# Patient Record
Sex: Female | Born: 1960
Health system: Southern US, Community
[De-identification: ages and names within clinical notes are randomized; demographics above are authoritative.]

## PROBLEM LIST (undated history)

## (undated) DIAGNOSIS — E119 Type 2 diabetes mellitus without complications: Secondary | ICD-10-CM

## (undated) DIAGNOSIS — E785 Hyperlipidemia, unspecified: Secondary | ICD-10-CM

## (undated) HISTORY — DX: Type 2 diabetes mellitus without complications: E11.9

## (undated) HISTORY — DX: Hyperlipidemia, unspecified: E78.5

## (undated) HISTORY — PX: HYSTEROSCOPY: SHX211

---

## 2016-08-12 ENCOUNTER — Ambulatory Visit (INDEPENDENT_AMBULATORY_CARE_PROVIDER_SITE_OTHER): Payer: BLUE CROSS/BLUE SHIELD

## 2016-08-12 ENCOUNTER — Ambulatory Visit (INDEPENDENT_AMBULATORY_CARE_PROVIDER_SITE_OTHER): Payer: BLUE CROSS/BLUE SHIELD | Admitting: Sports Medicine

## 2016-08-12 ENCOUNTER — Encounter: Payer: Self-pay | Admitting: Sports Medicine

## 2016-08-12 DIAGNOSIS — Z8781 Personal history of (healed) traumatic fracture: Secondary | ICD-10-CM | POA: Diagnosis not present

## 2016-08-12 DIAGNOSIS — S99921A Unspecified injury of right foot, initial encounter: Secondary | ICD-10-CM

## 2016-08-12 DIAGNOSIS — M79671 Pain in right foot: Secondary | ICD-10-CM

## 2016-08-12 DIAGNOSIS — M21619 Bunion of unspecified foot: Secondary | ICD-10-CM

## 2016-08-12 DIAGNOSIS — S8991XA Unspecified injury of right lower leg, initial encounter: Secondary | ICD-10-CM | POA: Diagnosis not present

## 2016-08-12 DIAGNOSIS — M779 Enthesopathy, unspecified: Secondary | ICD-10-CM

## 2016-08-12 NOTE — Patient Instructions (Signed)
Bunionectomy A bunionectomy is a surgical procedure to remove a bunion. A bunion is a visible bump of bone on the inside of your foot where your big toe meets the rest of your foot. A bunion can develop when pressure turns this bone (first metatarsal) toward the other toes. Shoes that are too tight are the most common cause of bunions. Bunions can also be caused by diseases, such as arthritis and polio. You may need a bunionectomy if your bunion is very large and painful or it affects your ability to walk. Tell a health care provider about:  Any allergies you have.  All medicines you are taking, including vitamins, herbs, eye drops, creams, and over-the-counter medicines.  Any problems you or family members have had with anesthetic medicines.  Any blood disorders you have.  Any surgeries you have had.  Any medical conditions you have. What are the risks? Generally, this is a safe procedure. However, problems may occur, including:  Infection.  Pain.  Nerve damage.  Bleeding or blood clots.  Reactions to medicines.  Numbness, stiffness, or arthritis in your toe.  Foot problems that continue even after the procedure. What happens before the procedure?  Ask your health care provider about:  Changing or stopping your regular medicines. This is especially important if you are taking diabetes medicines or blood thinners.  Taking medicines such as aspirin and ibuprofen. These medicines can thin your blood. Do not take these medicines before your procedure if your health care provider instructs you not to.  Do not drink alcohol before the procedure as directed by your health care provider.  Do not use tobacco products, including cigarettes, chewing tobacco, or electronic cigarettes, before the procedure as directed by your health care provider. If you need help quitting, ask your health care provider.  Ask your health care provider what kind of medicine you will be given during  your procedure. A bunionectomy may be done using one of these:  A medicine that numbs the area (local anesthetic).  A medicine that makes you go to sleep (general anesthetic). If you will be given general anesthetic, do not eat or drink anything after midnight on the night before the procedure or as directed by your health care provider. What happens during the procedure?  An IV tube may be inserted into a vein.  You will be given local anesthetic or general anesthetic.  The surgeon will make a cut (incision) over the enlarged area at the first joint of the big toe. The surgeon will remove the bunion.  You may have more than one incision if any of the bones in your big toe need to be moved. A bone itself may need to be cut.  Sometimes the tissues around the big toe may also need to be cut then tightened or loosened to reposition the toe.  Screws or other hardware may be used to keep your foot in thecorrect position.  The incision will be closed with stitches (sutures) and covered with adhesive strips or another type of bandage (dressing). What happens after the procedure?  You may spend some time in a recovery area.  Your blood pressure, heart rate, breathing rate, and blood oxygen level will be monitored often until the medicines you were given have worn off. This information is not intended to replace advice given to you by your health care provider. Make sure you discuss any questions you have with your health care provider. Document Released: 05/22/2005 Document Revised: 11/14/2015 Document Reviewed: 01/24/2014   Elsevier Interactive Patient Education  2017 [employer]  

## 2016-08-12 NOTE — Progress Notes (Signed)
Subjective: Kelsey Dickerson is a 56 y.o. female patient who presents to office for evaluation of Right bunion and second toe pain. Patient complains of progressive pain especially over the last year in the Right foot that starts as pain over the bump with direct pressure and range of motion; patient now has difficulty fitting shoes comfortably. Admits to a history of repeat fracture of right second toe with residual swelling. States that one time she even jammed the toe which are a steroid shot, however, after about 10 hours of work. There is pain and swelling at the base of the second toe joint and over bunion with redness. Patient has also tried changing shoes with minimal relief. Patient denies any other pedal complaints.   There are no active problems to display for this patient.   No current outpatient prescriptions on file prior to visit.   No current facility-administered medications on file prior to visit.     Not on File  Objective:  General: Alert and oriented x3 in no acute distress  Dermatology: No open lesions bilateral lower extremities, no webspace macerations, no ecchymosis bilateral, all nails x 10 are well manicured.  Vascular: Dorsalis Pedis and Posterior Tibial pedal pulses 2/4, Capillary Fill Time 3 seconds, (+) pedal hair growth bilateral, no edema bilateral lower extremities, Temperature gradient within normal limits.  Neurology: Gross sensation intact via light touch bilateral. Protective within normal limits. Patient admits to a history of prediabetes. A1c 5.6.  Musculoskeletal: Mild tenderness with palpation right bunion deformity, and with dorsiflexion of the right second toe with mild swelling focal to toe, there is decreased 1st MTPJ rom Right>Left with functional limitus noted, there is medial arch collapse Right> Left on weightbearing, rearfoot slight valgus, forefoot slight abduction with HAV deformity supported on ground with no second toe crossover deformity  noted.   Xrays  Right Foot    Impression: Intermetatarsal angle above normal limits , supportive of late great to early grade 3 bunion, with mild lateral deviation of the right second toe. No subsequent fracture, mild soft tissue swelling. Calcaneal spur. No other acute findings.      Assessment and Plan: Problem List Items Addressed This Visit    None    Visit Diagnoses    Right foot pain    -  Primary   Relevant Orders   DG Foot 2 Views Right (Completed)   Bunion       Capsulitis       Plantar plate injury, right, initial encounter       History of toe fracture       Right second with residual swelling       -Complete examination performed -Xrays reviewed -Discussed treatement options; discussed HAV deformityWith possible plantar plate tear in the setting of history of toe fracture at second toe on right;conservative and  Surgical management; risks, benefits, alternatives discussed. All patient's questions answered. -Rx MRI for further evaluation. Once completed, will formulate surgical plan. -Recommend continue with good supportive shoes and inserts.  -Patient to return to office after MRI or sooner if condition worsens.  Asencion Islamitorya Johnnae Impastato, DPM

## 2016-08-13 ENCOUNTER — Telehealth: Payer: Self-pay | Admitting: *Deleted

## 2016-08-13 DIAGNOSIS — M779 Enthesopathy, unspecified: Secondary | ICD-10-CM

## 2016-08-13 DIAGNOSIS — M21611 Bunion of right foot: Secondary | ICD-10-CM

## 2016-08-13 DIAGNOSIS — S99921D Unspecified injury of right foot, subsequent encounter: Secondary | ICD-10-CM

## 2016-08-13 NOTE — Telephone Encounter (Addendum)
-----   Message from [place]a Stover, [place] sent at 08/12/2016  4:34 PM EST ----- Regarding: MRI Right foot Bunion and 2nd toe joint eval for plantar plate tear. 08/13/2016-Orders given to [facility]. 08/24/2016-[facility] at 10:00am with 9:45am arrival. Left message with appt time. [facility].

## 2017-03-25 ENCOUNTER — Ambulatory Visit (INDEPENDENT_AMBULATORY_CARE_PROVIDER_SITE_OTHER): Payer: BLUE CROSS/BLUE SHIELD | Admitting: Sports Medicine

## 2017-03-25 ENCOUNTER — Encounter (INDEPENDENT_AMBULATORY_CARE_PROVIDER_SITE_OTHER): Payer: Self-pay

## 2017-03-25 DIAGNOSIS — S99921D Unspecified injury of right foot, subsequent encounter: Secondary | ICD-10-CM

## 2017-03-25 DIAGNOSIS — M21611 Bunion of right foot: Secondary | ICD-10-CM | POA: Diagnosis not present

## 2017-03-25 DIAGNOSIS — M779 Enthesopathy, unspecified: Secondary | ICD-10-CM | POA: Diagnosis not present

## 2017-03-25 DIAGNOSIS — M2041 Other hammer toe(s) (acquired), right foot: Secondary | ICD-10-CM | POA: Diagnosis not present

## 2017-03-25 DIAGNOSIS — M79671 Pain in right foot: Secondary | ICD-10-CM | POA: Diagnosis not present

## 2017-03-25 DIAGNOSIS — Z8781 Personal history of (healed) traumatic fracture: Secondary | ICD-10-CM

## 2017-03-25 NOTE — Patient Instructions (Signed)
Pre-Operative Instructions  Congratulations, you have decided to take an important step towards improving your quality of life.  You can be assured that the doctors and staff at [facility] will be with you every step of the way.  Here are some important things you should know:  1. Plan to be at the surgery center/hospital at least 1 (one) hour prior to your scheduled time, unless otherwise directed by the surgical center/hospital staff.  You must have a responsible adult accompany you, remain during the surgery and drive you home.  Make sure you have directions to the surgical center/hospital to ensure you arrive on time. 2. If you are having surgery at [facility] or [facility] hospitals, you will need a copy of your medical history and physical form from your family physician within one month prior to the date of surgery. We will give you a form for your primary physician to complete.  3. We make every effort to accommodate the date you request for surgery.  However, there are times where surgery dates or times have to be moved.  We will contact you as soon as possible if a change in schedule is required.   4. No aspirin/ibuprofen for one week before surgery.  If you are on aspirin, any non-steroidal anti-inflammatory medications (Mobic, Aleve, Ibuprofen) should not be taken seven (7) days prior to your surgery.  You make take Tylenol for pain prior to surgery.  5. Medications - If you are taking daily heart and blood pressure medications, seizure, reflux, allergy, asthma, anxiety, pain or diabetes medications, make sure you notify the surgery center/hospital before the day of surgery so they can tell you which medications you should take or avoid the day of surgery. 6. No food or drink after midnight the night before surgery unless directed otherwise by surgical center/hospital staff. 7. No alcoholic beverages 24-hours prior to surgery.  No smoking 24-hours prior or 24-hours after  surgery. 8. Wear loose pants or shorts. They should be loose enough to fit over bandages, boots, and casts. 9. Don't wear slip-on shoes. Sneakers are preferred. 10. Bring your boot with you to the surgery center/hospital.  Also bring crutches or a walker if your physician has prescribed it for you.  If you do not have this equipment, it will be provided for you after surgery. 11. If you have not been contacted by the surgery center/hospital by the day before your surgery, call to confirm the date and time of your surgery. 12. Leave-time from work may vary depending on the type of surgery you have.  Appropriate arrangements should be made prior to surgery with your employer. 13. Prescriptions will be provided immediately following surgery by your doctor.  Fill these as soon as possible after surgery and take the medication as directed. Pain medications will not be refilled on weekends and must be approved by the doctor. 14. Remove nail polish on the operative foot and avoid getting pedicures prior to surgery. 15. Wash the night before surgery.  The night before surgery wash the foot and leg well with water and the antibacterial soap provided. Be sure to pay special attention to beneath the toenails and in between the toes.  Wash for at least three (3) minutes. Rinse thoroughly with water and dry well with a towel.  Perform this wash unless told not to do so by your physician.  Enclosed: 1 Ice pack (please put in freezer the night before surgery)   1 Hibiclens skin cleaner     Pre-op instructions  If you have any questions regarding the instructions, please do not hesitate to call our office.  [place]: [address], [place], [place] [address] -- [phone]  [place]: [address] [place], [place] [address] -- [phone]  [place]: [address]  [place], [place] [address] -- [phone]  [place]: [address], [address], [place], [place] [address] -- [phone]  Website:  [email or web address] 

## 2017-03-25 NOTE — Progress Notes (Signed)
Subjective: Kelsey Dickerson is a 56 y.o. female patient who returns to office for follow-up evaluation of Right bunion and second toe pain. Patient states that she finally got her MRI done and now wants to stalk about surgery. Patient still complains of progressive pain that starts as pain over the bump with direct pressure and range of motion and has difficulty fitting shoes comfortably. Admits to pain and numbness over the second and now third toe. There is pain and swelling at the base of the second toe joint and over bunion with redness. Patient has also tried changing shoes, rest, icing, Motrin with minimal relief. Patient denies any other pedal complaints.   There are no active problems to display for this patient.   Current Outpatient Prescriptions on File Prior to Visit  Medication Sig Dispense Refill  . ACCU-CHEK AVIVA PLUS test strip TEST QD AS DIRECTED  6  . ACCU-CHEK SOFTCLIX LANCETS lancets TEST QD AS DIRECTED  6  . ciprofloxacin (CIPRO) 500 MG tablet     . metFORMIN (GLUCOPHAGE) 500 MG tablet     . simvastatin (ZOCOR) 40 MG tablet      No current facility-administered medications on file prior to visit.     Allergies  Allergen Reactions  . Oxycodone   . Penicillins    No family history on file.   No past surgical history on file.   Objective:  General: Alert and oriented x3 in no acute distress  Dermatology: No open lesions bilateral lower extremities, no webspace macerations, no ecchymosis bilateral, all nails x 10 are well manicured.  Vascular: Dorsalis Pedis and Posterior Tibial pedal pulses 2/4, Capillary Fill Time 3 seconds, (+) pedal hair growth bilateral, no edema bilateral lower extremities, Temperature gradient within normal limits.  Neurology: Gross sensation intact via light touch bilateral. Protective within normal limits. Patient admits to a history of prediabetes. A1c 5.6.  Musculoskeletal: Mild tenderness with palpation right bunion deformity, and with  dorsiflexion of the right second toe with mild swelling focal to toe, there is decreased 1st MTPJ rom Right>Left with functional limitus noted, there is medial arch collapse Right> Left on weightbearing, rearfoot slight valgus, forefoot slight abduction with HAV deformity supported on ground with no second toe crossover deformity noted.        Assessment and Plan: Problem List Items Addressed This Visit    None    Visit Diagnoses    Bunion, right foot    -  Primary   Relevant Orders   DME Other see comment   Capsulitis       Relevant Orders   DME Other see comment   Injury of plantar plate of right foot, subsequent encounter       Relevant Orders   DME Other see comment   Right foot pain       Relevant Orders   DME Other see comment   Hammer toe of right foot       Relevant Orders   DME Other see comment   History of toe fracture       Relevant Orders   DME Other see comment      -Complete examination performed -Xrays And previous MRI reviewed -Discussed treatement options; discussed HAV deformityWith possible plantar plate tear in the setting of history of toe fracture at second toe on right with residual digital deformity;conservative and  Surgical management; risks, benefits, alternatives discussed. All patient's questions answered. -Patient opt for surgical management. Consent obtained for right long arm Austin bunionectomy  with second hammertoe repair with K wire and possible second metatarsal osteotomy with plantar plate repair. Pre and Post op course explained. Risks, benefits, alternatives explained. No guarantees given or implied. Surgical booking slip submitted and provided patient with Surgical packet and info for [facility] -Dispensed CAM Walker to use post op and advised nonweightbearing for 6 weeks; ordered knee scooter and crutches -Recommend continue with good supportive shoes and inserts. Meanwhile until time of surgery.  -Patient to return to office after surgery or  sooner if condition worsens.  Asencion Islam, DPM

## 2017-03-31 ENCOUNTER — Telehealth: Payer: Self-pay | Admitting: *Deleted

## 2017-03-31 NOTE — Telephone Encounter (Signed)
"  I'm calling to try to get an appointment set up for foot surgery with Dr. Marylene Land at the [facility].  If you will please give me a call."

## 2017-04-06 NOTE — Telephone Encounter (Signed)
I'm trying to see if I can get a surgery date of November 12.  Is anything available for that.  I'm trying to get my family leave papers done.  If you can give me a call back."  I'm returning your call.  November 12 is available for surgery.  "Randie Heinz, that works out well.  Sonny Dandy was talking to me about FMLA paperwork and I told them I didn't have a date yet.  You will give me a call a day or two prior to the surgery date with the time?"  No, I will not but someone from the surgical center will call you with the arrival time.

## 2017-04-22 HISTORY — PX: FOOT SURGERY: SHX648

## 2017-05-03 ENCOUNTER — Encounter: Payer: Self-pay | Admitting: Sports Medicine

## 2017-05-03 DIAGNOSIS — M2041 Other hammer toe(s) (acquired), right foot: Secondary | ICD-10-CM | POA: Diagnosis not present

## 2017-05-03 DIAGNOSIS — M2011 Hallux valgus (acquired), right foot: Secondary | ICD-10-CM | POA: Diagnosis not present

## 2017-05-03 DIAGNOSIS — M205X1 Other deformities of toe(s) (acquired), right foot: Secondary | ICD-10-CM | POA: Diagnosis not present

## 2017-05-03 DIAGNOSIS — M7751 Other enthesopathy of right foot: Secondary | ICD-10-CM | POA: Diagnosis not present

## 2017-05-04 ENCOUNTER — Telehealth: Payer: Self-pay | Admitting: Sports Medicine

## 2017-05-04 NOTE — Telephone Encounter (Signed)
Post op check, patient states that she is doing good. Block is starting to wear off but pain medication is helping. Patient states that her Dilaudid pain medication is helping. Patient denies any other symptoms. I reminded patient to continue with rest, ice, elevation, and use of knee scooter. Patient to follow up as scheduled for continued post care. -Dr. Marylene LandStover

## 2017-05-05 ENCOUNTER — Telehealth: Payer: Self-pay | Admitting: *Deleted

## 2017-05-05 NOTE — Telephone Encounter (Signed)
We have you scheduled for surgery on 05/19/2017.  Are you having surgery on your other foot?  "No, I'm not.  November 12 was the only date I was supposed to be scheduled."  I wanted to make sure before I canceled it.  How's your foot doing?  "It's doing well, no complaints.  Well keep it elevated as much as possible.  Take your pain medications as needed.  Use the ice packs.  "Okay thank you."

## 2017-05-12 ENCOUNTER — Ambulatory Visit (INDEPENDENT_AMBULATORY_CARE_PROVIDER_SITE_OTHER): Payer: BLUE CROSS/BLUE SHIELD | Admitting: Sports Medicine

## 2017-05-12 ENCOUNTER — Encounter: Payer: Self-pay | Admitting: Sports Medicine

## 2017-05-12 ENCOUNTER — Ambulatory Visit (INDEPENDENT_AMBULATORY_CARE_PROVIDER_SITE_OTHER): Payer: BLUE CROSS/BLUE SHIELD

## 2017-05-12 DIAGNOSIS — M2041 Other hammer toe(s) (acquired), right foot: Secondary | ICD-10-CM | POA: Diagnosis not present

## 2017-05-12 DIAGNOSIS — M79671 Pain in right foot: Secondary | ICD-10-CM

## 2017-05-12 DIAGNOSIS — Z9889 Other specified postprocedural states: Secondary | ICD-10-CM

## 2017-05-12 DIAGNOSIS — M21611 Bunion of right foot: Secondary | ICD-10-CM

## 2017-05-12 DIAGNOSIS — S99921D Unspecified injury of right foot, subsequent encounter: Secondary | ICD-10-CM

## 2017-05-12 NOTE — Progress Notes (Signed)
Subjective: Kelsey Dickerson is a 56 y.o. female patient seen today in office for POV #1 (DOS 05-03-17), S/P Right long arm austin bunionectomy and 2nd hammertoe repair with kwire and plantar plate repair. Patient denies pain at surgical site, denies calf pain, denies headache, chest pain, shortness of breath, nausea, vomiting, fever, or chills. Patient states that she is doing well and is only taking Ibuprofen as needed and has not taken her pain medication since Thursday. No other issues noted.   There are no active problems to display for this patient.   Current Outpatient Medications on File Prior to Visit  Medication Sig Dispense Refill  . ACCU-CHEK AVIVA PLUS test strip TEST QD AS DIRECTED  6  . ACCU-CHEK SOFTCLIX LANCETS lancets TEST QD AS DIRECTED  6  . ciprofloxacin (CIPRO) 500 MG tablet     . metFORMIN (GLUCOPHAGE) 500 MG tablet     . simvastatin (ZOCOR) 40 MG tablet      No current facility-administered medications on file prior to visit.     Allergies  Allergen Reactions  . Oxycodone   . Penicillins     Objective: There were no vitals filed for this visit.  General: No acute distress, AAOx3  Right foot: Kwire and Sutures intact with no gapping or dehiscence at surgical site, mild swelling to right forefoot, no erythema, no warmth, no drainage, no signs of infection noted, Capillary fill time <3 seconds in all digits, gross sensation present via light touch to right foot. No pain or crepitation with range of motion right foot except with mild guarding at surgical sites.  No pain with calf compression.   Post Op Xray, Right foot: Kwire in excellent alignment and position. Osteotomy site healing. Hardware intact at 1st metatarsal. Soft tissue swelling within normal limits for post op status.   Assessment and Plan:  Problem List Items Addressed This Visit    None    Visit Diagnoses    Hammer toe of right foot    -  Primary   Relevant Orders   DG Foot Complete Right   Bunion, right foot       Injury of plantar plate of right foot, subsequent encounter       Right foot pain       S/P foot surgery, right          -Patient seen and evaluated -Xrays reviewed  -Applied dry sterile dressing to surgical site right foot secured with ACE wrap and stockinet  -Advised patient to make sure to keep dressings clean, dry, and intact to right surgical site, removing the ACE as needed  -Advised patient to continue with CAM boot and NWB on right with assistance of knee scooter -Advised patient to limit activity to necessity  -Advised patient to ice and elevate as necessary  -Continue with PRN meds -Will plan for possible suture removal at next office visit. Rober MinionKwire will stay in place for at least 1 month. In the meantime, patient to call office if any issues or problems arise.   Asencion Islamitorya Sekai Nayak, DPM

## 2017-05-19 ENCOUNTER — Encounter: Payer: BLUE CROSS/BLUE SHIELD | Admitting: Sports Medicine

## 2017-05-20 ENCOUNTER — Encounter: Payer: Self-pay | Admitting: Sports Medicine

## 2017-05-20 ENCOUNTER — Ambulatory Visit (INDEPENDENT_AMBULATORY_CARE_PROVIDER_SITE_OTHER): Payer: BLUE CROSS/BLUE SHIELD | Admitting: Sports Medicine

## 2017-05-20 DIAGNOSIS — Z9889 Other specified postprocedural states: Secondary | ICD-10-CM

## 2017-05-20 DIAGNOSIS — M21611 Bunion of right foot: Secondary | ICD-10-CM

## 2017-05-20 DIAGNOSIS — S99921D Unspecified injury of right foot, subsequent encounter: Secondary | ICD-10-CM

## 2017-05-20 DIAGNOSIS — M2041 Other hammer toe(s) (acquired), right foot: Secondary | ICD-10-CM

## 2017-05-20 DIAGNOSIS — M79671 Pain in right foot: Secondary | ICD-10-CM

## 2017-05-20 NOTE — Progress Notes (Signed)
Subjective: Kelsey Dickerson is a 56 y.o. female patient seen today in office for POV #2 (DOS 05-03-17), S/P Right long arm austin bunionectomy and 2nd hammertoe repair with kwire and plantar plate repair. Patient denies pain at surgical site, denies calf pain, denies headache, chest pain, shortness of breath, nausea, vomiting, fever, or chills. Patient states that she is doing well and is only taking Tylenol as needed. No other issues noted.   There are no active problems to display for this patient.   Current Outpatient Medications on File Prior to Visit  Medication Sig Dispense Refill  . ACCU-CHEK AVIVA PLUS test strip TEST QD AS DIRECTED  6  . ACCU-CHEK SOFTCLIX LANCETS lancets TEST QD AS DIRECTED  6  . ciprofloxacin (CIPRO) 500 MG tablet     . metFORMIN (GLUCOPHAGE) 500 MG tablet     . simvastatin (ZOCOR) 40 MG tablet      No current facility-administered medications on file prior to visit.     Allergies  Allergen Reactions  . Oxycodone   . Penicillins     Objective: There were no vitals filed for this visit.  General: No acute distress, AAOx3  Right foot: Kwire and Sutures intact with no gapping or dehiscence at surgical site, mild swelling to right forefoot, no erythema, no warmth, no drainage, no signs of infection noted, Capillary fill time <3 seconds in all digits, gross sensation present via light touch to right foot. No pain or crepitation with range of motion right foot except with mild guarding at surgical sites.  No pain with calf compression.   Assessment and Plan:  Problem List Items Addressed This Visit    None    Visit Diagnoses    Hammer toe of right foot    -  Primary   Bunion, right foot       Injury of plantar plate of right foot, subsequent encounter       Right foot pain       S/P foot surgery, right          -Patient seen and evaluated -Sutures removed -Applied dry sterile dressing to surgical site right foot secured with ACE wrap and stockinet   -Advised patient to make sure to keep dressings clean, dry, and intact to right surgical site, removing the ACE as needed  -Advised patient to continue with CAM boot and NWB on right with assistance of knee scooter -Advised patient to limit activity to necessity  -Advised patient to ice and elevate as necessary  -Continue with PRN meds -Will plan for possible K wire removal and transition to weightbearing with boot at next office visit.  In the meantime, patient to call office if any issues or problems arise.   Asencion Islamitorya [name], DPM

## 2017-06-04 ENCOUNTER — Encounter: Payer: Self-pay | Admitting: Sports Medicine

## 2017-06-04 ENCOUNTER — Ambulatory Visit (INDEPENDENT_AMBULATORY_CARE_PROVIDER_SITE_OTHER): Payer: BLUE CROSS/BLUE SHIELD

## 2017-06-04 ENCOUNTER — Ambulatory Visit (INDEPENDENT_AMBULATORY_CARE_PROVIDER_SITE_OTHER): Payer: BLUE CROSS/BLUE SHIELD | Admitting: Sports Medicine

## 2017-06-04 DIAGNOSIS — M21611 Bunion of right foot: Secondary | ICD-10-CM | POA: Diagnosis not present

## 2017-06-04 DIAGNOSIS — M79671 Pain in right foot: Secondary | ICD-10-CM

## 2017-06-04 DIAGNOSIS — M2041 Other hammer toe(s) (acquired), right foot: Secondary | ICD-10-CM

## 2017-06-04 DIAGNOSIS — Z9889 Other specified postprocedural states: Secondary | ICD-10-CM

## 2017-06-04 DIAGNOSIS — S99921D Unspecified injury of right foot, subsequent encounter: Secondary | ICD-10-CM

## 2017-06-04 NOTE — Progress Notes (Signed)
Subjective: Emerie SwazilandJordan is a 56 y.o. female patient seen today in office for POV #3 (DOS 05-03-17), S/P Right long arm austin bunionectomy and 2nd hammertoe repair with kwire and plantar plate repair. Patient denies pain at surgical site, admits swelling but no pain, denies calf pain, denies headache, chest pain, shortness of breath, nausea, vomiting, fever, or chills. Patient states that she is doing well and is only taking Tylenol as needed. No other issues noted.   There are no active problems to display for this patient.   Current Outpatient Medications on File Prior to Visit  Medication Sig Dispense Refill  . ACCU-CHEK AVIVA PLUS test strip TEST QD AS DIRECTED  6  . ACCU-CHEK SOFTCLIX LANCETS lancets TEST QD AS DIRECTED  6  . ciprofloxacin (CIPRO) 500 MG tablet     . metFORMIN (GLUCOPHAGE) 500 MG tablet     . simvastatin (ZOCOR) 40 MG tablet      No current facility-administered medications on file prior to visit.     Allergies  Allergen Reactions  . Oxycodone   . Penicillins     Objective: There were no vitals filed for this visit.  General: No acute distress, AAOx3  Right foot: Kwire intact at 2nd toe with no gapping or dehiscence at surgical site(s), moderate swelling to right forefoot, no erythema, no warmth, no drainage, no signs of infection noted, Capillary fill time <3 seconds in all digits, gross sensation present via light touch to right foot. No pain or crepitation with range of motion right foot.  No pain with calf compression. No signs of DVT.   Assessment and Plan:  Problem List Items Addressed This Visit    None    Visit Diagnoses    Bunion, right foot    -  Primary   Relevant Orders   DG Foot Complete Right   Hammer toe of right foot       Injury of plantar plate of right foot, subsequent encounter       Right foot pain       S/P foot surgery, right          -Patient seen and evaluated -Xrays consistent with post op status no acute findings  -Kwire  removed -Applied steristrips to 2nd toe and Surgi-tube compression sleeve -Patient may shower on tomorrow  -Advised patient to limit activity to tolerance  -Advised patient to ice and elevate as necessary -Advised motrin OTC to assist with edema control at post op foot   -Continue with PRN meds -Will plan for transition to post op shoe and to allow patient to use OTC scar creams at next office visit.  In the meantime, patient to call office if any issues or problems arise.   Asencion Islamitorya Maat Kafer, DPM

## 2017-06-17 ENCOUNTER — Encounter: Payer: Self-pay | Admitting: Podiatry

## 2017-06-17 ENCOUNTER — Ambulatory Visit (INDEPENDENT_AMBULATORY_CARE_PROVIDER_SITE_OTHER): Payer: BLUE CROSS/BLUE SHIELD | Admitting: Podiatry

## 2017-06-17 ENCOUNTER — Ambulatory Visit: Payer: BLUE CROSS/BLUE SHIELD

## 2017-06-17 DIAGNOSIS — Z9889 Other specified postprocedural states: Secondary | ICD-10-CM | POA: Diagnosis not present

## 2017-06-17 DIAGNOSIS — M21611 Bunion of right foot: Secondary | ICD-10-CM

## 2017-06-17 NOTE — Progress Notes (Signed)
  Subjective:  Patient ID: Kelsey Dickerson, female    DOB: 07/24/1960,  MRN: [id number]  Chief Complaint  Patient presents with  . Routine Post Op    (DOS 05-03-17), S/P Right long arm austin bunionectomy and 2nd hammertoe repair with kwire and plantar plate repair   DOS: 05/03/17 Procedure: Right long-arm Austin bunionectomy second hammertoe repair, plantar plate repair  56 y.o. female returns for post-op check. Last saw Dr. Marylene LandStover 12/14 where she had the pin removed from her toe.  Doing well but does endorse some swelling.  States that wearing compression stocking does help.  Objective:   General AA&O x3. Normal mood and affect.  Vascular Foot warm and well perfused.  Neurologic Gross sensation intact.  Dermatologic  incisions well-healed with slight hypertrophic scar  Orthopedic: Slight tenderness to palpation noted about the surgical site.    Assessment & Plan:  Patient was evaluated and treated and all questions answered.  S/p R Long-arm Austin ectomy, second hammertoe repair, plantar plate repair -Progressing as expected post-operatively. -Skin well healed. -Tubigrip dispensed -Dispensed surgical shoe.  F/u with Dr. Marylene LandStover 2 weeks for new XRs.

## 2017-07-01 ENCOUNTER — Encounter: Payer: Self-pay | Admitting: Sports Medicine

## 2017-07-01 ENCOUNTER — Ambulatory Visit (INDEPENDENT_AMBULATORY_CARE_PROVIDER_SITE_OTHER): Payer: BLUE CROSS/BLUE SHIELD

## 2017-07-01 ENCOUNTER — Ambulatory Visit (INDEPENDENT_AMBULATORY_CARE_PROVIDER_SITE_OTHER): Payer: BLUE CROSS/BLUE SHIELD | Admitting: Sports Medicine

## 2017-07-01 DIAGNOSIS — Z9889 Other specified postprocedural states: Secondary | ICD-10-CM

## 2017-07-01 DIAGNOSIS — S99921D Unspecified injury of right foot, subsequent encounter: Secondary | ICD-10-CM

## 2017-07-01 DIAGNOSIS — M2041 Other hammer toe(s) (acquired), right foot: Secondary | ICD-10-CM

## 2017-07-01 DIAGNOSIS — M7989 Other specified soft tissue disorders: Secondary | ICD-10-CM

## 2017-07-01 DIAGNOSIS — M21611 Bunion of right foot: Secondary | ICD-10-CM

## 2017-07-01 DIAGNOSIS — M79671 Pain in right foot: Secondary | ICD-10-CM

## 2017-07-01 NOTE — Progress Notes (Signed)
Subjective: Reality Swazil[place]Jordan is a 57 y.o. female patient seen today in office for POV #5 (DOS 05-03-17), S/P Right long arm austin bunionectomy and 2nd hammertoe repair with kwire and plantar plate repair. Patient denies pain at surgical site, admits swelling and some sensitivity along her incision sites, denies calf pain, denies headache, chest pain, shortness of breath, nausea, vomiting, fever, or chills. Patient states that she is doing well and is only taking Tylenol as needed but is still concerned about the swelling. No other issues noted.   There are no active problems to display for this patient.   Current Outpatient Medications on File Prior to Visit  Medication Sig Dispense Refill  . ACCU-CHEK AVIVA PLUS test strip TEST QD AS DIRECTED  6  . ACCU-CHEK SOFTCLIX LANCETS lancets TEST QD AS DIRECTED  6  . ciprofloxacin (CIPRO) 500 MG tablet     . metFORMIN (GLUCOPHAGE) 500 MG tablet     . simvastatin (ZOCOR) 40 MG tablet      No current facility-administered medications on file prior to visit.     Allergies  Allergen Reactions  . Oxycodone   . Penicillins     Objective: There were no vitals filed for this visit.  General: No acute distress, AAOx3  Right foot: Incision sites healing well, moderate swelling to right forefoot, no erythema, no warmth, no drainage, no signs of infection noted, Capillary fill time <3 seconds in all digits, gross sensation present via light touch to right foot. No pain or crepitation with range of motion right foot.  No pain with calf compression. No signs of DVT.   Assessment and Plan:  Problem List Items Addressed This Visit    None    Visit Diagnoses    Bunion, right foot    -  Primary   Relevant Orders   DG Foot Complete Right   Hammer toe of right foot       Injury of plantar plate of right foot, subsequent encounter       Post-operative state       Swelling of right foot       Right foot pain          -Patient seen and evaluated -Xrays  consistent with post op status, hardware intact, no acute findings  -Recommend topical scar creams and gels and pain relievers -Dispense compression anklet to use as instructed -Continue with postop shoe and slowly wean to tennis shoe as swelling decreases -Advised patient to limit activity to tolerance  -Advised patient to ice and elevate as necessary -Advised motrin OTC to assist with edema control at post op foot   -Continue with PRN meds -Will plan for postop check and increasing activities at next office visit.  In the meantime, patient to call office if any issues or problems arise.   Asencion Islamitorya Tereasa Yilmaz, DPM

## 2017-07-15 ENCOUNTER — Telehealth: Payer: Self-pay | Admitting: Sports Medicine

## 2017-07-15 ENCOUNTER — Encounter: Payer: Self-pay | Admitting: Sports Medicine

## 2017-07-15 ENCOUNTER — Ambulatory Visit (INDEPENDENT_AMBULATORY_CARE_PROVIDER_SITE_OTHER): Payer: BLUE CROSS/BLUE SHIELD | Admitting: Sports Medicine

## 2017-07-15 DIAGNOSIS — M79671 Pain in right foot: Secondary | ICD-10-CM

## 2017-07-15 DIAGNOSIS — M21611 Bunion of right foot: Secondary | ICD-10-CM

## 2017-07-15 DIAGNOSIS — Z9889 Other specified postprocedural states: Secondary | ICD-10-CM

## 2017-07-15 DIAGNOSIS — M7989 Other specified soft tissue disorders: Secondary | ICD-10-CM

## 2017-07-15 DIAGNOSIS — M2041 Other hammer toe(s) (acquired), right foot: Secondary | ICD-10-CM

## 2017-07-15 DIAGNOSIS — S99921D Unspecified injury of right foot, subsequent encounter: Secondary | ICD-10-CM

## 2017-07-15 NOTE — Progress Notes (Signed)
For subjective: Kelsey Dickerson is a 57 y.o. female patient seen today in office for POV #6 (DOS 05-03-17), S/P Right long arm austin bunionectomy and 2nd hammertoe repair with kwire and plantar plate repair. Patient denies pain at surgical site, admits swelling and some sensitivity along her incision 1st incision and to the ball of the foot that is not bad, glad to be back in tennis shoes, denies calf pain, denies headache, chest pain, shortness of breath, nausea, vomiting, fever, or chills. No other issues noted.   There are no active problems to display for this patient.   Current Outpatient Medications on File Prior to Visit  Medication Sig Dispense Refill  . ACCU-CHEK AVIVA PLUS test strip TEST QD AS DIRECTED  6  . ACCU-CHEK SOFTCLIX LANCETS lancets TEST QD AS DIRECTED  6  . ciprofloxacin (CIPRO) 500 MG tablet     . metFORMIN (GLUCOPHAGE) 500 MG tablet     . simvastatin (ZOCOR) 40 MG tablet      No current facility-administered medications on file prior to visit.     Allergies  Allergen Reactions  . Oxycodone   . Penicillins     Objective: There were no vitals filed for this visit.  General: No acute distress, AAOx3  Right foot: Incision sites well healed, mild swelling to right forefoot, no erythema, no warmth, no drainage, no signs of infection noted, Capillary fill time <3 seconds in all digits, gross sensation present via light touch to right foot. No pain or crepitation with range of motion right foot.  No pain with calf compression. No signs of DVT.   Assessment and Plan:  Problem List Items Addressed This Visit    None    Visit Diagnoses    Bunion, right foot    -  Primary   Hammer toe of right foot       Injury of plantar plate of right foot, subsequent encounter       Post-operative state       Swelling of right foot       Right foot pain       S/P foot surgery, right          -Patient seen and evaluated -Dispensed metatarsal padding  -Recommend increase range  of motion exercises  -Recommend continue with topical scar creams and gels and pain relievers -Continue with compression anklet to use as instructed -Continue with good supportive tennis shoes -Advised patient to limit activity to tolerance  -Advised patient to ice and elevate as necessary -Advised motrin OTC to assist with edema control at post op foot   -Continue with PRN meds -Work note: Return on 07-26-17. Modified work hours of no more than 5 hours per shift, full duties without any limitations.  -Will plan for final post op check and increasing work hours at next office visit.  In the meantime, patient to call office if any issues or problems arise.   Asencion Islamitorya Dustina Scoggin, DPM

## 2017-07-15 NOTE — Telephone Encounter (Signed)
Pt was seen today wants to know if she can go back to work full time 10 hours if they have sit down jobs. She doesn't think they will allow she to do the 5 hours shift.

## 2017-07-16 NOTE — Telephone Encounter (Signed)
Yes she can do 10 hours if they allow sitting Thanks Dr. SKathie Rhodes

## 2017-07-23 NOTE — Progress Notes (Signed)
DoS 05/03/17 Rt bunionectomy, 2nd hammertoe repair, 2nd metatarsal osteotmy and plantar plate repair

## 2017-08-12 ENCOUNTER — Encounter: Payer: Self-pay | Admitting: Sports Medicine

## 2017-08-12 ENCOUNTER — Ambulatory Visit (INDEPENDENT_AMBULATORY_CARE_PROVIDER_SITE_OTHER): Payer: BLUE CROSS/BLUE SHIELD | Admitting: Sports Medicine

## 2017-08-12 DIAGNOSIS — M722 Plantar fascial fibromatosis: Secondary | ICD-10-CM

## 2017-08-12 DIAGNOSIS — M2041 Other hammer toe(s) (acquired), right foot: Secondary | ICD-10-CM

## 2017-08-12 DIAGNOSIS — S99921D Unspecified injury of right foot, subsequent encounter: Secondary | ICD-10-CM

## 2017-08-12 DIAGNOSIS — Z9889 Other specified postprocedural states: Secondary | ICD-10-CM

## 2017-08-12 DIAGNOSIS — M21611 Bunion of right foot: Secondary | ICD-10-CM

## 2017-08-12 NOTE — Progress Notes (Signed)
For subjective: Kelsey Dickerson is a 57 y.o. female patient seen today in office for POV #7 (DOS 05-03-17), S/P Right long arm austin bunionectomy and 2nd hammertoe repair with kwire and plantar plate repair. Patient admits a little bit of tenderness now that she is back at work, admits swelling and some occasional tingling pain along the incision areas.  Patient states that she has picked up scar cream and gel which has helped and states that little by little she is getting more motion in her toes.  Reports that she has been using wider shoes with no issues and reports that she was working 5 hours with a job that allows sitting.  Patient denies calf pain, denies headache, chest pain, shortness of breath, nausea, vomiting, fever, or chills. No other issues noted.   There are no active problems to display for this patient.   Current Outpatient Medications on File Prior to Visit  Medication Sig Dispense Refill  . ACCU-CHEK AVIVA PLUS test strip TEST QD AS DIRECTED  6  . ACCU-CHEK SOFTCLIX LANCETS lancets TEST QD AS DIRECTED  6  . ciprofloxacin (CIPRO) 500 MG tablet     . metFORMIN (GLUCOPHAGE) 500 MG tablet     . simvastatin (ZOCOR) 40 MG tablet      No current facility-administered medications on file prior to visit.     Allergies  Allergen Reactions  . Oxycodone   . Penicillins     Objective: There were no vitals filed for this visit.  General: No acute distress, AAOx3  Right foot: Incision sites well healed, mild swelling to right forefoot, no erythema, no warmth, no drainage, no signs of infection noted, Capillary fill time <3 seconds in all digits, gross sensation present via light touch to right foot. No pain or crepitation with range of motion right foot.  No pain with calf compression. No signs of DVT.   No reproducible pain along plantar fascia history of pain and fatigue in both feet.  Assessment and Plan:  Problem List Items Addressed This Visit    None    Visit Diagnoses    Bunion, right foot    -  Primary   Hammer toe of right foot       Injury of plantar plate of right foot, subsequent encounter       Post-operative state       Swelling of right foot       Right foot pain       S/P foot surgery, right          -Patient seen and evaluated -Discussed long-term care after surgery -Recommend to continue with range of motion exercises  -Recommend continue with topical scar creams and gels and pain relievers as needed -Continue with compression anklet to use as instructed until swelling is resolved -Continue with good supportive tennis shoes and may slowly try other shoes as tolerated -Advised patient to limit activity to tolerance  -Advised patient to ice and elevate as necessary -Advised motrin OTC to assist with edema control at post op foot as needed -Advised patient to consider custom molded orthotics.  Office will check benefits and schedule if patient desires to proceed especially since patient has had right foot surgery and a history of bunion starting on the left and a history of pain and fatigue and feet. -Work note: Full duty of 10 hours with allowance of sitting as needed for foot pain -Will plan for x-ray and final post op check at next office visit  in 6 weeks.  In the meantime, patient to call office if any issues or problems arise.   Kelsey Dickerson, DPM

## 2017-08-16 ENCOUNTER — Other Ambulatory Visit: Payer: Self-pay | Admitting: Sports Medicine

## 2017-08-16 DIAGNOSIS — M2041 Other hammer toe(s) (acquired), right foot: Secondary | ICD-10-CM

## 2017-08-16 DIAGNOSIS — M21611 Bunion of right foot: Secondary | ICD-10-CM

## 2017-08-16 DIAGNOSIS — Z9889 Other specified postprocedural states: Secondary | ICD-10-CM

## 2017-08-31 ENCOUNTER — Other Ambulatory Visit: Payer: Self-pay | Admitting: Sports Medicine

## 2017-08-31 DIAGNOSIS — S99921D Unspecified injury of right foot, subsequent encounter: Secondary | ICD-10-CM

## 2017-08-31 DIAGNOSIS — Z9889 Other specified postprocedural states: Secondary | ICD-10-CM

## 2017-08-31 DIAGNOSIS — M2041 Other hammer toe(s) (acquired), right foot: Secondary | ICD-10-CM

## 2017-08-31 DIAGNOSIS — M21611 Bunion of right foot: Secondary | ICD-10-CM

## 2017-09-23 ENCOUNTER — Other Ambulatory Visit: Payer: Self-pay | Admitting: Sports Medicine

## 2017-09-23 ENCOUNTER — Encounter: Payer: Self-pay | Admitting: Sports Medicine

## 2017-09-23 ENCOUNTER — Ambulatory Visit (INDEPENDENT_AMBULATORY_CARE_PROVIDER_SITE_OTHER): Payer: BLUE CROSS/BLUE SHIELD | Admitting: Sports Medicine

## 2017-09-23 ENCOUNTER — Ambulatory Visit (INDEPENDENT_AMBULATORY_CARE_PROVIDER_SITE_OTHER): Payer: BLUE CROSS/BLUE SHIELD

## 2017-09-23 DIAGNOSIS — M2041 Other hammer toe(s) (acquired), right foot: Secondary | ICD-10-CM | POA: Diagnosis not present

## 2017-09-23 DIAGNOSIS — M21611 Bunion of right foot: Secondary | ICD-10-CM

## 2017-09-23 DIAGNOSIS — S99921D Unspecified injury of right foot, subsequent encounter: Secondary | ICD-10-CM

## 2017-09-23 DIAGNOSIS — Z9889 Other specified postprocedural states: Secondary | ICD-10-CM

## 2017-09-23 NOTE — Progress Notes (Signed)
For subjective: Kelsey Dickerson is a 57 y.o. female patient seen today in office for POV #8 (DOS 05-03-17), S/P Right long arm austin bunionectomy and 2nd hammertoe repair with kwire and plantar plate repair. Patient admits to having still a lot of swelling and pain with wearing compression sleeve; felt tight took it off, felt like it was cutting off circulation. Patient denies calf pain, denies headache, chest pain, shortness of breath, nausea, vomiting, fever, or chills. No other issues noted.   There are no active problems to display for this patient.   Current Outpatient Medications on File Prior to Visit  Medication Sig Dispense Refill  . ACCU-CHEK AVIVA PLUS test strip TEST QD AS DIRECTED  6  . ACCU-CHEK SOFTCLIX LANCETS lancets TEST QD AS DIRECTED  6  . ciprofloxacin (CIPRO) 500 MG tablet     . metFORMIN (GLUCOPHAGE) 500 MG tablet     . simvastatin (ZOCOR) 40 MG tablet      No current facility-administered medications on file prior to visit.     Allergies  Allergen Reactions  . Oxycodone   . Penicillins     Objective: There were no vitals filed for this visit.  General: No acute distress, AAOx3  Right foot: Incision sites well healed, mild swelling to right forefoot, no erythema, no warmth, no drainage, no signs of infection noted, Capillary fill time <3 seconds in all digits, gross sensation present via light touch to right foot. No pain or crepitation with range of motion right foot.  No pain with calf compression. No signs of DVT.   Xrays- within normal limits  Assessment and Plan:  Problem List Items Addressed This Visit    None    Visit Diagnoses    Bunion, right foot    -  Primary   Hammer toe of right foot       Injury of plantar plate of right foot, subsequent encounter       Post-operative state       Swelling of right foot       Right foot pain       S/P foot surgery, right          -Patient seen and evaluated -Discussed long-term care after  surgery -Recommend to continue with range of motion exercises  -Recommend continue with topical scar creams and gels and pain relievers as needed -May discontinue compression anklet at this time since experienced increased pain with it -Continue with good supportive tennis shoes and may slowly try other shoes as tolerated as previously advised -Advised patient to limit activity to tolerance  -Advised patient to ice and elevate as necessary -Advised motrin OTC to assist with edema control at post op foot as needed -Work note: Full duty no restrictions -Return in 4 months if swelling continues for venous duplex studies to evaluate for need for compression grade medical garments.  In the meantime, patient to call office if any issues or problems arise.   Kelsey Dickerson, DPM

## 2018-01-06 DIAGNOSIS — E119 Type 2 diabetes mellitus without complications: Secondary | ICD-10-CM | POA: Diagnosis not present

## 2018-01-06 DIAGNOSIS — E1169 Type 2 diabetes mellitus with other specified complication: Secondary | ICD-10-CM | POA: Diagnosis not present

## 2018-01-06 DIAGNOSIS — Z6835 Body mass index (BMI) 35.0-35.9, adult: Secondary | ICD-10-CM | POA: Diagnosis not present

## 2018-01-06 DIAGNOSIS — E782 Mixed hyperlipidemia: Secondary | ICD-10-CM | POA: Diagnosis not present

## 2018-02-02 ENCOUNTER — Ambulatory Visit: Payer: BLUE CROSS/BLUE SHIELD | Admitting: Sports Medicine

## 2018-03-10 ENCOUNTER — Ambulatory Visit (INDEPENDENT_AMBULATORY_CARE_PROVIDER_SITE_OTHER): Payer: BLUE CROSS/BLUE SHIELD | Admitting: Sports Medicine

## 2018-03-10 ENCOUNTER — Encounter: Payer: Self-pay | Admitting: Sports Medicine

## 2018-03-10 DIAGNOSIS — M2041 Other hammer toe(s) (acquired), right foot: Secondary | ICD-10-CM

## 2018-03-10 DIAGNOSIS — Z1231 Encounter for screening mammogram for malignant neoplasm of breast: Secondary | ICD-10-CM | POA: Diagnosis not present

## 2018-03-10 DIAGNOSIS — M7989 Other specified soft tissue disorders: Secondary | ICD-10-CM

## 2018-03-10 DIAGNOSIS — M21611 Bunion of right foot: Secondary | ICD-10-CM

## 2018-03-10 DIAGNOSIS — Z9889 Other specified postprocedural states: Secondary | ICD-10-CM

## 2018-03-10 NOTE — Progress Notes (Signed)
For subjective: Kelsey Dickerson is a 57 y.o. female patient seen today in office for POV #9 (DOS 05-03-17), S/P Right long arm austin bunionectomy and 2nd hammertoe repair with kwire and plantar plate repair. Patient reports that swelling is a lot better very minimal and denies any pain or issues to her surgical sites.  Patient denies calf pain, denies headache, chest pain, shortness of breath, nausea, vomiting, fever, or chills. No other issues noted.   There are no active problems to display for this patient.   Current Outpatient Medications on File Prior to Visit  Medication Sig Dispense Refill  . ACCU-CHEK AVIVA PLUS test strip TEST QD AS DIRECTED  6  . ACCU-CHEK SOFTCLIX LANCETS lancets TEST QD AS DIRECTED  6  . ciprofloxacin (CIPRO) 500 MG tablet     . metFORMIN (GLUCOPHAGE) 500 MG tablet     . simvastatin (ZOCOR) 40 MG tablet      No current facility-administered medications on file prior to visit.     Allergies  Allergen Reactions  . Oxycodone   . Penicillins     Objective: There were no vitals filed for this visit.  General: No acute distress, AAOx3  Right foot: Incision sites well healed, minimal swelling to right forefoot, no erythema, no warmth, no drainage, no signs of infection noted, Capillary fill time <3 seconds in all digits, gross sensation present via light touch to right foot. No pain or crepitation with range of motion right foot.  No pain with calf compression. No signs of DVT.   Assessment and Plan:  Problem List Items Addressed This Visit    None    Visit Diagnoses    Bunion, right foot    -  Primary   Hammer toe of right foot       Injury of plantar plate of right foot, subsequent encounter       Post-operative state       Swelling of right foot       Right foot pain       S/P foot surgery, right          -Patient seen and evaluated -Postoperative swelling appears to be much improved  -Advised patient to continue with good supportive shoes and  compression garments as needed may also continue with scar creams and gels as needed -Advised motrin OTC to assist with edema control at post op foot as needed -Return PRN.  In the meantime, patient to call office if any issues or problems arise.   Kelsey Dickerson, DPM

## 2018-06-08 DIAGNOSIS — E782 Mixed hyperlipidemia: Secondary | ICD-10-CM | POA: Diagnosis not present

## 2018-06-08 DIAGNOSIS — Z6835 Body mass index (BMI) 35.0-35.9, adult: Secondary | ICD-10-CM | POA: Diagnosis not present

## 2018-06-08 DIAGNOSIS — N951 Menopausal and female climacteric states: Secondary | ICD-10-CM | POA: Diagnosis not present

## 2018-06-08 DIAGNOSIS — E1169 Type 2 diabetes mellitus with other specified complication: Secondary | ICD-10-CM | POA: Diagnosis not present

## 2018-11-30 DIAGNOSIS — E782 Mixed hyperlipidemia: Secondary | ICD-10-CM | POA: Diagnosis not present

## 2018-11-30 DIAGNOSIS — E1169 Type 2 diabetes mellitus with other specified complication: Secondary | ICD-10-CM | POA: Diagnosis not present

## 2019-04-13 DIAGNOSIS — Z23 Encounter for immunization: Secondary | ICD-10-CM | POA: Diagnosis not present

## 2019-04-13 DIAGNOSIS — E1169 Type 2 diabetes mellitus with other specified complication: Secondary | ICD-10-CM | POA: Diagnosis not present

## 2019-04-13 DIAGNOSIS — E782 Mixed hyperlipidemia: Secondary | ICD-10-CM | POA: Diagnosis not present

## 2019-07-08 DIAGNOSIS — Z1231 Encounter for screening mammogram for malignant neoplasm of breast: Secondary | ICD-10-CM | POA: Diagnosis not present

## 2019-07-09 DIAGNOSIS — Z1211 Encounter for screening for malignant neoplasm of colon: Secondary | ICD-10-CM | POA: Diagnosis not present

## 2019-09-14 ENCOUNTER — Ambulatory Visit (INDEPENDENT_AMBULATORY_CARE_PROVIDER_SITE_OTHER): Payer: BC Managed Care – PPO | Admitting: Legal Medicine

## 2019-09-14 ENCOUNTER — Encounter: Payer: Self-pay | Admitting: Legal Medicine

## 2019-09-14 ENCOUNTER — Other Ambulatory Visit: Payer: Self-pay

## 2019-09-14 VITALS — BP 120/80 | HR 74 | Temp 96.2°F | Resp 17 | Ht 63.5 in | Wt 230.0 lb

## 2019-09-14 DIAGNOSIS — R195 Other fecal abnormalities: Secondary | ICD-10-CM | POA: Diagnosis not present

## 2019-09-14 DIAGNOSIS — E782 Mixed hyperlipidemia: Secondary | ICD-10-CM | POA: Insufficient documentation

## 2019-09-14 DIAGNOSIS — E1169 Type 2 diabetes mellitus with other specified complication: Secondary | ICD-10-CM | POA: Diagnosis not present

## 2019-09-14 DIAGNOSIS — N951 Menopausal and female climacteric states: Secondary | ICD-10-CM | POA: Diagnosis not present

## 2019-09-14 DIAGNOSIS — E119 Type 2 diabetes mellitus without complications: Secondary | ICD-10-CM | POA: Insufficient documentation

## 2019-09-14 LAB — POCT UA - MICROALBUMIN: Microalbumin Ur, POC: 30 mg/L

## 2019-09-14 MED ORDER — SIMVASTATIN 40 MG PO TABS: 40.0000 mg | ORAL_TABLET | Freq: Every day | ORAL | 6 refills | Status: DC

## 2019-09-14 MED ORDER — ACCU-CHEK SOFTCLIX LANCETS MISC
1.0000 | Freq: Every day | 6 refills | Status: AC
Start: 2019-09-14 — End: ?

## 2019-09-14 MED ORDER — ACCU-CHEK AVIVA PLUS VI STRP: 1.0000 | ORAL_STRIP | 6 refills | Status: AC | PRN

## 2019-09-14 NOTE — Assessment & Plan Note (Signed)

## 2019-09-14 NOTE — Progress Notes (Signed)
Established Patient Office Visit  Subjective:  Patient ID: Kelsey Dickerson, female    DOB: Aug 17, 1960  Age: 59 y.o. MRN: [id number]  CC:  Chief Complaint  Patient presents with  . Hyperlipidemia  . Diabetes    HPI Kelsey Dickerson presents for Chronic visit.  Patient present with type 2 diabetes.  Specifically, this is type 2, noninsuin requiring diabetes, complicated by hypercholesterolemia and hypertension.  Compliance with treatment has been good; patient take medicines as directed, maintains diet and exercise regimen, follows up as directed, and is keeping glucose diary.  Date of  diagnosis 2016.  Depression screen has been performed.Tobacco screen nonsmoker. Current medicines for diabetes metformin.  Patient is on lisinopril for renal protection and simvastatin for cholesterol control.  Patient performs foot exams daily and last ophthalmologic exam was need to get  Patient presents with hyperlipidemia.  Compliance with treatment has been good; patient takes medicines as directed, maintains low cholesterol diet, follows up as directed, and maintains exercise regimen.  Patient is using simvastatin without problems..  Past Medical History:  Diagnosis Date  . Diabetes mellitus without complication ([place])   . Hyperlipidemia     Past Surgical History:  Procedure Laterality Date  . FOOT SURGERY Right 04/2017  . HYSTEROSCOPY      No family history on file.  Social History   Socioeconomic History  . Marital status: Married    Spouse name: Not on file  . Number of children: Not on file  . Years of education: Not on file  . Highest education level: Not on file  Occupational History  . Not on file  Tobacco Use  . Smoking status: Never Smoker  . Smokeless tobacco: Never Used  Substance and Sexual Activity  . Alcohol use: Not Currently  . Drug use: Never  . Sexual activity: Yes    Partners: Male  Other Topics Concern  . Not on file  Social History Narrative  . Not on file    Social Determinants of Health   Financial Resource Strain:   . Difficulty of Paying Living Expenses:   Food Insecurity:   . Worried About Charity fundraiser in the Last Year:   . Arboriculturist in the Last Year:   Transportation Needs:   . Film/video editor (Medical):   Marland Kitchen Lack of Transportation (Non-Medical):   Physical Activity:   . Days of Exercise per Week:   . Minutes of Exercise per Session:   Stress:   . Feeling of Stress :   Social Connections:   . Frequency of Communication with Friends and Family:   . Frequency of Social Gatherings with Friends and Family:   . Attends Religious Services:   . Active Member of Clubs or Organizations:   . Attends Archivist Meetings:   Marland Kitchen Marital Status:   Intimate Partner Violence:   . Fear of Current or Ex-Partner:   . Emotionally Abused:   Marland Kitchen Physically Abused:   . Sexually Abused:     Outpatient Medications Prior to Visit  Medication Sig Dispense Refill  . lisinopril (ZESTRIL) 10 MG tablet Take 10 mg by mouth daily.    . metFORMIN (GLUCOPHAGE) 500 MG tablet Take 500 mg by mouth 2 (two) times daily with a meal.     . ACCU-CHEK AVIVA PLUS test strip TEST QD AS DIRECTED  6  . ACCU-CHEK SOFTCLIX LANCETS lancets TEST QD AS DIRECTED  6  . simvastatin (ZOCOR) 40 MG tablet Take 40 mg  by mouth daily.     . ciprofloxacin (CIPRO) 500 MG tablet      No facility-administered medications prior to visit.    Allergies  Allergen Reactions  . Oxycodone   . Penicillins     ROS Review of Systems  Constitutional: Negative.   HENT: Negative.   Eyes: Negative.   Respiratory: Negative.   Cardiovascular: Negative.   Gastrointestinal: Negative.   Endocrine: Negative.   Genitourinary: Negative.   Musculoskeletal: Negative.   Skin: Negative.   Allergic/Immunologic: Negative.   Neurological: Negative.   Hematological: Negative.   Psychiatric/Behavioral: Negative.       Objective:    Physical Exam  Constitutional:  She is oriented to person, place, and time. She appears well-developed and well-nourished.  HENT:  Head: Normocephalic and atraumatic.  Eyes: Pupils are equal, round, and reactive to light. Conjunctivae and EOM are normal.  Cardiovascular: Normal rate, regular rhythm and normal heart sounds.  Pulmonary/Chest: Effort normal and breath sounds normal.  Abdominal: Soft. Bowel sounds are normal.  Musculoskeletal:        General: Normal range of motion.     Cervical back: Normal range of motion and neck supple.  Neurological: She is alert and oriented to person, place, and time.  Skin: Skin is warm and dry.  Psychiatric: She has a normal mood and affect.  Vitals reviewed.   BP 120/80 (BP Location: Right Arm, Patient Position: Sitting)   Pulse 74   Temp (!) 96.2 F (35.7 C) (Temporal)   Resp 17   Ht 5' 3.5" (1.613 m)   Wt 230 lb (104.3 kg)   BMI 40.10 kg/m  Wt Readings from Last 3 Encounters:  09/14/19 230 lb (104.3 kg)     Health Maintenance Due  Topic Date Due  . HEMOGLOBIN A1C  Never done  . Hepatitis C Screening  Never done  . PNEUMOCOCCAL POLYSACCHARIDE VACCINE AGE 46-64 HIGH RISK  Never done  . FOOT EXAM  Never done  . OPHTHALMOLOGY EXAM  Never done  . HIV Screening  Never done  . TETANUS/TDAP  Never done  . PAP SMEAR-Modifier  Never done  . MAMMOGRAM  Never done  . COLONOSCOPY  Never done  . INFLUENZA VACCINE  Never done    There are no preventive care reminders to display for this patient.  No results found for: TSH No results found for: WBC, HGB, HCT, MCV, PLT No results found for: NA, K, CHLORIDE, CO2, GLUCOSE, BUN, CREATININE, BILITOT, ALKPHOS, AST, ALT, PROT, ALBUMIN, CALCIUM, ANIONGAP, EGFR, GFR No results found for: CHOL No results found for: HDL No results found for: LDLCALC No results found for: TRIG No results found for: CHOLHDL No results found for: HGBA1C    Assessment & Plan:   Problem List Items Addressed This Visit      Endocrine   Diabetes  mellitus ([place]) - Primary    An individual care plan was established and reinforced today.  The patient's status was assessed using clinical findings on exam, labs and diagnostic testing. Patient success at meeting goals based on disease specific evidence-based guidelines and found to be good controlled. Medications were assessed and patient's understanding of the medical issues , including barriers were assessed. Recommend adherence to a diabetic diet, a graduated exercise program, HgbA1c level is checked quarterly, and urine microalbumin performed yearly .  Annual mono-filament sensation testing performed. Lower blood pressure and control hyperlipidemia is important. Get annual eye exams and annual flu shots and smoking cessation discussed.  Self management goals were discussed.      Relevant Medications   lisinopril (ZESTRIL) 10 MG tablet   simvastatin (ZOCOR) 40 MG tablet   ACCU-CHEK AVIVA PLUS test strip   Accu-Chek Softclix Lancets lancets   Other Relevant Orders   POCT UA - Microalbumin (Completed)   CBC with Differential   Comprehensive metabolic panel   Hemoglobin A1c     Other   Mixed hyperlipidemia    AN INDIVIDUAL CARE PLAN was established and reinforced today.  The patient's status was assessed using clinical findings on exam, lab and other diagnostic tests. The patient's disease status was assessed based on evidence-based guidelines and found to be well controlled. MEDICATIONS were reviewed. SELF MANAGEMENT GOALS have been discussed and patient's success at attaining the goal of low cholesterol was assessed. RECOMMENDATION given include regular exercise 3 days a week and low cholesterol/low fat diet. CLINICAL SUMMARY including written plan to identify barriers unique to the patient due to social or economic  reasons was discussed.      Relevant Medications   lisinopril (ZESTRIL) 10 MG tablet   simvastatin (ZOCOR) 40 MG tablet   Other Relevant Orders   Lipid Panel    Menopausal state    Patient menopausal, symptoms are stable.       Other Visit Diagnoses    Positive colorectal cancer screening using Cologuard test       Relevant Orders   Ambulatory referral to Gastroenterology      Meds ordered this encounter  Medications  . simvastatin (ZOCOR) 40 MG tablet    Sig: Take 1 tablet (40 mg total) by mouth daily.    Dispense:  30 tablet    Refill:  6  . ACCU-CHEK AVIVA PLUS test strip    Sig: 1 each by Other route as needed for other. Use as instructed    Dispense:  100 each    Refill:  6  . Accu-Chek Softclix Lancets lancets    Sig: 1 each by Other route daily. Use as instructed    Dispense:  100 each    Refill:  6    Follow-up: Return in about 4 months (around 01/14/2020) for fasting.    Reinaldo Meeker, MD

## 2019-09-14 NOTE — Assessment & Plan Note (Signed)
Patient menopausal, symptoms are stable.

## 2019-09-14 NOTE — Assessment & Plan Note (Signed)

## 2019-09-15 ENCOUNTER — Other Ambulatory Visit: Payer: Self-pay

## 2019-09-15 DIAGNOSIS — E1169 Type 2 diabetes mellitus with other specified complication: Secondary | ICD-10-CM

## 2019-09-15 LAB — CARDIOVASCULAR RISK ASSESSMENT

## 2019-09-15 LAB — COMPREHENSIVE METABOLIC PANEL
ALT: 28 IU/L (ref 0–32)
AST: 23 IU/L (ref 0–40)
Albumin/Globulin Ratio: 1.8 (ref 1.2–2.2)
Albumin: 4.4 g/dL (ref 3.8–4.9)
Alkaline Phosphatase: 110 IU/L (ref 39–117)
BUN/Creatinine Ratio: 23 (ref 9–23)
BUN: 16 mg/dL (ref 6–24)
Bilirubin Total: 0.4 mg/dL (ref 0.0–1.2)
CO2: 25 mmol/L (ref 20–29)
Calcium: 9.7 mg/dL (ref 8.7–10.2)
Chloride: 103 mmol/L (ref 96–106)
Creatinine, Ser: 0.69 mg/dL (ref 0.57–1.00)
GFR calc Af Amer: 112 mL/min/{1.73_m2} (ref >59)
GFR calc non Af Amer: 97 mL/min/{1.73_m2} (ref >59)
Globulin, Total: 2.4 g/dL (ref 1.5–4.5)
Glucose: 105 mg/dL — ABNORMAL HIGH (ref 65–99)
Potassium: 5.3 mmol/L — ABNORMAL HIGH (ref 3.5–5.2)
Sodium: 141 mmol/L (ref 134–144)
Total Protein: 6.8 g/dL (ref 6.0–8.5)

## 2019-09-15 LAB — LIPID PANEL
Chol/HDL Ratio: 2.5 ratio (ref 0.0–4.4)
Cholesterol, Total: 165 mg/dL (ref 100–199)
HDL: 67 mg/dL (ref >39)
LDL Chol Calc (NIH): 78 mg/dL (ref 0–99)
Triglycerides: 112 mg/dL (ref 0–149)
VLDL Cholesterol Cal: 20 mg/dL (ref 5–40)

## 2019-09-15 LAB — CBC WITH DIFFERENTIAL/PLATELET
Basophils Absolute: 0.1 10*3/uL (ref 0.0–0.2)
Basos: 1 %
EOS (ABSOLUTE): 0.2 10*3/uL (ref 0.0–0.4)
Eos: 4 %
Hematocrit: 39.8 % (ref 34.0–46.6)
Hemoglobin: 13 g/dL (ref 11.1–15.9)
Immature Grans (Abs): 0 10*3/uL (ref 0.0–0.1)
Immature Granulocytes: 0 %
Lymphocytes Absolute: 1.6 10*3/uL (ref 0.7–3.1)
Lymphs: 34 %
MCH: 29.4 pg (ref 26.6–33.0)
MCHC: 32.7 g/dL (ref 31.5–35.7)
MCV: 90 fL (ref 79–97)
Monocytes Absolute: 0.4 10*3/uL (ref 0.1–0.9)
Monocytes: 8 %
Neutrophils Absolute: 2.4 10*3/uL (ref 1.4–7.0)
Neutrophils: 53 %
Platelets: 225 10*3/uL (ref 150–450)
RBC: 4.42 x10E6/uL (ref 3.77–5.28)
RDW: 13.1 % (ref 11.7–15.4)
WBC: 4.7 10*3/uL (ref 3.4–10.8)

## 2019-09-15 LAB — HEMOGLOBIN A1C
Est. average glucose Bld gHb Est-mCnc: 123 mg/dL
Hgb A1c MFr Bld: 5.9 % — ABNORMAL HIGH (ref 4.8–5.6)

## 2019-09-15 NOTE — Progress Notes (Signed)
CBC normal, Glucose 105, potassium high at 5.3- recheck one week, liver tests ok, Cholesterol good, A1c 5.9 good lp

## 2019-09-20 ENCOUNTER — Other Ambulatory Visit: Payer: BC Managed Care – PPO

## 2019-09-20 ENCOUNTER — Other Ambulatory Visit: Payer: Self-pay

## 2019-09-20 DIAGNOSIS — E1169 Type 2 diabetes mellitus with other specified complication: Secondary | ICD-10-CM | POA: Diagnosis not present

## 2019-09-21 LAB — COMPREHENSIVE METABOLIC PANEL
ALT: 14 IU/L (ref 0–32)
AST: 18 IU/L (ref 0–40)
Albumin/Globulin Ratio: 1.7 (ref 1.2–2.2)
Albumin: 4.5 g/dL (ref 3.8–4.9)
Alkaline Phosphatase: 101 IU/L (ref 39–117)
BUN/Creatinine Ratio: 27 — ABNORMAL HIGH (ref 9–23)
BUN: 17 mg/dL (ref 6–24)
Bilirubin Total: 0.3 mg/dL (ref 0.0–1.2)
CO2: 25 mmol/L (ref 20–29)
Calcium: 9.8 mg/dL (ref 8.7–10.2)
Chloride: 101 mmol/L (ref 96–106)
Creatinine, Ser: 0.63 mg/dL (ref 0.57–1.00)
GFR calc Af Amer: 115 mL/min/{1.73_m2} (ref >59)
GFR calc non Af Amer: 100 mL/min/{1.73_m2} (ref >59)
Globulin, Total: 2.6 g/dL (ref 1.5–4.5)
Glucose: 93 mg/dL (ref 65–99)
Potassium: 4 mmol/L (ref 3.5–5.2)
Sodium: 140 mmol/L (ref 134–144)
Total Protein: 7.1 g/dL (ref 6.0–8.5)

## 2019-09-21 NOTE — Progress Notes (Signed)
Renal tests Bun high- increase fluids, potassium normal, liver tests normal lp

## 2019-10-09 DIAGNOSIS — K921 Melena: Secondary | ICD-10-CM | POA: Diagnosis not present

## 2019-10-28 DIAGNOSIS — Z1159 Encounter for screening for other viral diseases: Secondary | ICD-10-CM | POA: Diagnosis not present

## 2019-10-28 DIAGNOSIS — Z20828 Contact with and (suspected) exposure to other viral communicable diseases: Secondary | ICD-10-CM | POA: Diagnosis not present

## 2019-10-28 DIAGNOSIS — J3489 Other specified disorders of nose and nasal sinuses: Secondary | ICD-10-CM | POA: Diagnosis not present

## 2019-11-02 ENCOUNTER — Other Ambulatory Visit: Payer: Self-pay | Admitting: Legal Medicine

## 2019-11-03 DIAGNOSIS — E119 Type 2 diabetes mellitus without complications: Secondary | ICD-10-CM | POA: Diagnosis not present

## 2019-11-03 DIAGNOSIS — Z885 Allergy status to narcotic agent status: Secondary | ICD-10-CM | POA: Diagnosis not present

## 2019-11-03 DIAGNOSIS — Z79899 Other long term (current) drug therapy: Secondary | ICD-10-CM | POA: Diagnosis not present

## 2019-11-03 DIAGNOSIS — Z7984 Long term (current) use of oral hypoglycemic drugs: Secondary | ICD-10-CM | POA: Diagnosis not present

## 2019-11-03 DIAGNOSIS — Z1211 Encounter for screening for malignant neoplasm of colon: Secondary | ICD-10-CM | POA: Diagnosis not present

## 2019-11-03 DIAGNOSIS — Z87898 Personal history of other specified conditions: Secondary | ICD-10-CM | POA: Diagnosis not present

## 2019-11-03 DIAGNOSIS — K921 Melena: Secondary | ICD-10-CM | POA: Diagnosis not present

## 2019-11-03 HISTORY — PX: COLONOSCOPY: SHX174

## 2019-11-03 LAB — HM COLONOSCOPY

## 2020-01-19 ENCOUNTER — Ambulatory Visit (INDEPENDENT_AMBULATORY_CARE_PROVIDER_SITE_OTHER): Payer: BC Managed Care – PPO | Admitting: Legal Medicine

## 2020-01-19 ENCOUNTER — Other Ambulatory Visit: Payer: Self-pay

## 2020-01-19 ENCOUNTER — Encounter: Payer: Self-pay | Admitting: Legal Medicine

## 2020-01-19 VITALS — BP 108/70 | HR 91 | Temp 97.7°F | Resp 17 | Ht 64.96 in | Wt 228.0 lb

## 2020-01-19 DIAGNOSIS — N951 Menopausal and female climacteric states: Secondary | ICD-10-CM

## 2020-01-19 DIAGNOSIS — Z6837 Body mass index (BMI) 37.0-37.9, adult: Secondary | ICD-10-CM | POA: Diagnosis not present

## 2020-01-19 DIAGNOSIS — E782 Mixed hyperlipidemia: Secondary | ICD-10-CM

## 2020-01-19 DIAGNOSIS — E1169 Type 2 diabetes mellitus with other specified complication: Secondary | ICD-10-CM | POA: Diagnosis not present

## 2020-01-19 NOTE — Progress Notes (Signed)
Subjective:  Patient ID: Kelsey Dickerson, female    DOB: 1960/07/08  Age: 59 y.o. MRN: [id number]  Chief Complaint  Patient presents with  . Diabetes    HPI: chronic follow up  Patient present with type 2 diabetes.  Specifically, this is type 2, noninsulin requiring diabetes, complicated by hyperlipidemia and obesity.  Compliance with treatment has been good; patient take medicines as directed, maintains diet and exercise regimen, follows up as directed, and is keeping glucose diary.  Date of  diagnosis 2015.  Depression screen has been performed.Tobacco screen nonsmoker. Current medicines for diabetes metformin.  Patient is on lsinopril for renal protection and simvastatin for cholesterol control.  Patient performs foot exams daily and last ophthalmologic exam was this year  Patient presents with hyperlipidemia.  Compliance with treatment has been good; patient takes medicines as directed, maintains low cholesterol diet, follows up as directed, and maintains exercise regimen.  Patient is using simvasttin without problems..   Current Outpatient Medications on File Prior to Visit  Medication Sig Dispense Refill  . ACCU-CHEK AVIVA PLUS test strip 1 each by Other route as needed for other. Use as instructed 100 each 6  . Accu-Chek Softclix Lancets lancets 1 each by Other route daily. Use as instructed 100 each 6  . lisinopril (ZESTRIL) 10 MG tablet TAKE 1 TABLET(10 MG) BY MOUTH EVERY DAY 90 tablet 2  . metFORMIN (GLUCOPHAGE) 500 MG tablet TAKE 1 TABLET BY MOUTH TWICE DAILY 60 tablet 2  . simvastatin (ZOCOR) 40 MG tablet Take 1 tablet (40 mg total) by mouth daily. 30 tablet 6   No current facility-administered medications on file prior to visit.   Past Medical History:  Diagnosis Date  . Diabetes mellitus without complication ([facility])   . Hyperlipidemia    Past Surgical History:  Procedure Laterality Date  . COLONOSCOPY  11/03/2019  . FOOT SURGERY Right 04/2017  . HYSTEROSCOPY      Family  History  Problem Relation Age of Onset  . Kidney cancer Mother   . Dementia Father    Social History   Socioeconomic History  . Marital status: Married    Spouse name: Not on file  . Number of children: Not on file  . Years of education: Not on file  . Highest education level: Not on file  Occupational History  . Not on file  Tobacco Use  . Smoking status: Never Smoker  . Smokeless tobacco: Never Used  Substance and Sexual Activity  . Alcohol use: Not Currently  . Drug use: Never  . Sexual activity: Yes    Partners: Male  Other Topics Concern  . Not on file  Social History Narrative  . Not on file   Social Determinants of Health   Financial Resource Strain:   . Difficulty of Paying Living Expenses:   Food Insecurity:   . Worried About Programme researcher, broadcasting/film/video in the Last Year:   . Barista in the Last Year:   Transportation Needs:   . Freight forwarder (Medical):   Marland Kitchen Lack of Transportation (Non-Medical):   Physical Activity:   . Days of Exercise per Week:   . Minutes of Exercise per Session:   Stress:   . Feeling of Stress :   Social Connections:   . Frequency of Communication with Friends and Family:   . Frequency of Social Gatherings with Friends and Family:   . Attends Religious Services:   . Active Member of Clubs or Organizations:   .  Attends Banker Meetings:   Marland Kitchen Marital Status:     Review of Systems  Constitutional: Negative.   HENT: Negative.   Eyes: Negative.   Respiratory: Negative.   Cardiovascular: Negative.   Gastrointestinal: Negative.   Endocrine: Negative.   Genitourinary: Negative.   Musculoskeletal: Negative.   Skin: Negative.   Neurological: Negative.   Psychiatric/Behavioral: Negative.      Objective:  BP 108/70 (BP Location: Right Arm, Patient Position: Sitting)   Pulse 91   Temp 97.7 F (36.5 C) (Temporal)   Resp 17   Ht 5' 4.96" (1.65 m)   Wt (!) 228 lb (103.4 kg)   SpO2 95%   BMI 37.99 kg/m    BP/Weight 01/19/2020 09/14/2019  Systolic BP 108 120  Diastolic BP 70 80  Wt. (Lbs) 228 230  BMI 37.99 40.1    Physical Exam Vitals reviewed.  Constitutional:      Appearance: Normal appearance.  HENT:     Right Ear: Tympanic membrane normal.     Left Ear: Tympanic membrane normal.     Mouth/Throat:     Mouth: Mucous membranes are dry.  Eyes:     Extraocular Movements: Extraocular movements intact.     Conjunctiva/sclera: Conjunctivae normal.     Pupils: Pupils are equal, round, and reactive to light.  Cardiovascular:     Rate and Rhythm: Normal rate and regular rhythm.     Pulses: Normal pulses.     Heart sounds: Normal heart sounds.  Pulmonary:     Effort: Pulmonary effort is normal.     Breath sounds: Normal breath sounds.  Abdominal:     General: Abdomen is flat. Bowel sounds are normal.     Palpations: Abdomen is soft.  Musculoskeletal:        General: Normal range of motion.     Cervical back: Normal range of motion and neck supple.  Skin:    General: Skin is warm and dry.     Capillary Refill: Capillary refill takes less than 2 seconds.  Neurological:     General: No focal deficit present.     Mental Status: She is alert and oriented to person, place, and time.  Psychiatric:        Mood and Affect: Mood normal.        Thought Content: Thought content normal.       Lab Results  Component Value Date   WBC 4.5 01/19/2020   HGB 13.1 01/19/2020   HCT 39.4 01/19/2020   PLT 232 01/19/2020   GLUCOSE 110 (H) 01/19/2020   CHOL 167 01/19/2020   TRIG 79 01/19/2020   HDL 66 01/19/2020   LDLCALC 86 01/19/2020   ALT 16 01/19/2020   AST 20 01/19/2020   NA 137 01/19/2020   K 4.8 01/19/2020   CL 100 01/19/2020   CREATININE 0.69 01/19/2020   BUN 20 01/19/2020   CO2 24 01/19/2020   TSH 0.706 01/19/2020   HGBA1C 6.0 (H) 01/19/2020   MICROALBUR 30 09/14/2019      Assessment & Plan:   1. Type 2 diabetes mellitus with other specified complication, without  long-term current use of insulin ([facility]CC) - Comprehensive metabolic panel - Hemoglobin A1c - CBC with Differential/Platelet An individual care plan for diabetes was established and reinforced today.  The patient's status was assessed using clinical findings on exam, labs and diagnostic testing. Patient success at meeting goals based on disease specific evidence-based guidelines and found to be good controlled. Medications were  assessed and patient's understanding of the medical issues , including barriers were assessed. Recommend adherence to a diabetic diet, a graduated exercise program, HgbA1c level is checked quarterly, and urine microalbumin performed yearly .  Annual mono-filament sensation testing performed. Lower blood pressure and control hyperlipidemia is important. Get annual eye exams and annual flu shots and smoking cessation discussed.  Self management goals were discussed.  2. Mixed hyperlipidemia - Lipid panel - TSH AN INDIVIDUAL CARE PLAN for hyperlipidemia/ cholesterol was established and reinforced today.  The patient's status was assessed using clinical findings on exam, lab and other diagnostic tests. The patient's disease status was assessed based on evidence-based guidelines and found to be well controlled. MEDICATIONS were reviewed. SELF MANAGEMENT GOALS have been discussed and patient's success at attaining the goal of low cholesterol was assessed. RECOMMENDATION given include regular exercise 3 days a week and low cholesterol/low fat diet. CLINICAL SUMMARY including written plan to identify barriers unique to the patient due to social or economic  reasons was discussed.  3. Menopausal state Stable, no hot flushes  4. BMI 37.0-37.9, adult An individualize plan was formulated for obesity using patient history and physical exam to encourage weight loss.  An evidence based program was formulated.  Patient is to cut portion size with meals and to plan physical exercise 3 days a  week at least 20 minutes.  Weight watchers and other programs are helpful.  Planned amount of weight loss 10 lbs.With diabetes and hyperlipidemia, she meets criteria for morbid obesity.  5. Morbid obesity ([facility])  An individualize plan was formulated for obesity using patient history and physical exam to encourage weight loss.  An evidence based program was formulated.  Patient is to cut portion size with meals and to plan physical exercise 3 days a week at least 20 minutes.  Weight watchers and other programs are helpful.  Planned amount of weight loss 10 lbs.  No orders of the defined types were placed in this encounter.   Orders Placed This Encounter  Procedures  . Comprehensive metabolic panel  . Hemoglobin A1c  . Lipid panel  . CBC with Differential/Platelet  . TSH  . Cardiovascular Risk Assessment     Follow-up: Return in about 4 months (around 05/21/2020) for fasting.  An After Visit Summary was printed and given to the patient.  Brent Bulla [facility] [phone]

## 2020-01-20 LAB — COMPREHENSIVE METABOLIC PANEL
ALT: 16 IU/L (ref 0–32)
AST: 20 IU/L (ref 0–40)
Albumin/Globulin Ratio: 1.9 (ref 1.2–2.2)
Albumin: 4.5 g/dL (ref 3.8–4.9)
Alkaline Phosphatase: 101 IU/L (ref 48–121)
BUN/Creatinine Ratio: 29 — ABNORMAL HIGH (ref 9–23)
BUN: 20 mg/dL (ref 6–24)
Bilirubin Total: 0.4 mg/dL (ref 0.0–1.2)
CO2: 24 mmol/L (ref 20–29)
Calcium: 9.7 mg/dL (ref 8.7–10.2)
Chloride: 100 mmol/L (ref 96–106)
Creatinine, Ser: 0.69 mg/dL (ref 0.57–1.00)
GFR calc Af Amer: 112 mL/min/{1.73_m2} (ref >59)
GFR calc non Af Amer: 97 mL/min/{1.73_m2} (ref >59)
Globulin, Total: 2.4 g/dL (ref 1.5–4.5)
Glucose: 110 mg/dL — ABNORMAL HIGH (ref 65–99)
Potassium: 4.8 mmol/L (ref 3.5–5.2)
Sodium: 137 mmol/L (ref 134–144)
Total Protein: 6.9 g/dL (ref 6.0–8.5)

## 2020-01-20 LAB — LIPID PANEL
Chol/HDL Ratio: 2.5 ratio (ref 0.0–4.4)
Cholesterol, Total: 167 mg/dL (ref 100–199)
HDL: 66 mg/dL (ref >39)
LDL Chol Calc (NIH): 86 mg/dL (ref 0–99)
Triglycerides: 79 mg/dL (ref 0–149)
VLDL Cholesterol Cal: 15 mg/dL (ref 5–40)

## 2020-01-20 LAB — CBC WITH DIFFERENTIAL/PLATELET
Basophils Absolute: 0.1 10*3/uL (ref 0.0–0.2)
Basos: 1 %
EOS (ABSOLUTE): 0.2 10*3/uL (ref 0.0–0.4)
Eos: 5 %
Hematocrit: 39.4 % (ref 34.0–46.6)
Hemoglobin: 13.1 g/dL (ref 11.1–15.9)
Immature Grans (Abs): 0 10*3/uL (ref 0.0–0.1)
Immature Granulocytes: 0 %
Lymphocytes Absolute: 1.6 10*3/uL (ref 0.7–3.1)
Lymphs: 36 %
MCH: 30 pg (ref 26.6–33.0)
MCHC: 33.2 g/dL (ref 31.5–35.7)
MCV: 90 fL (ref 79–97)
Monocytes Absolute: 0.5 10*3/uL (ref 0.1–0.9)
Monocytes: 10 %
Neutrophils Absolute: 2.1 10*3/uL (ref 1.4–7.0)
Neutrophils: 48 %
Platelets: 232 10*3/uL (ref 150–450)
RBC: 4.37 x10E6/uL (ref 3.77–5.28)
RDW: 13 % (ref 11.7–15.4)
WBC: 4.5 10*3/uL (ref 3.4–10.8)

## 2020-01-20 LAB — TSH: TSH: 0.706 u[IU]/mL (ref 0.450–4.500)

## 2020-01-20 LAB — HEMOGLOBIN A1C
Est. average glucose Bld gHb Est-mCnc: 126 mg/dL
Hgb A1c MFr Bld: 6 % — ABNORMAL HIGH (ref 4.8–5.6)

## 2020-01-20 LAB — CARDIOVASCULAR RISK ASSESSMENT

## 2020-01-21 DIAGNOSIS — Z6838 Body mass index (BMI) 38.0-38.9, adult: Secondary | ICD-10-CM | POA: Insufficient documentation

## 2020-01-21 NOTE — Progress Notes (Signed)
Glucose 110, kidney and liver tests normal, may be dehydrated.A1c 6.0, Cholesterol normal, CBC normal, TSH 0.706 normal lp

## 2020-01-31 ENCOUNTER — Other Ambulatory Visit: Payer: Self-pay | Admitting: Legal Medicine

## 2020-02-07 DIAGNOSIS — J3489 Other specified disorders of nose and nasal sinuses: Secondary | ICD-10-CM | POA: Diagnosis not present

## 2020-02-07 DIAGNOSIS — Z20828 Contact with and (suspected) exposure to other viral communicable diseases: Secondary | ICD-10-CM | POA: Diagnosis not present

## 2020-05-23 DIAGNOSIS — Z20828 Contact with and (suspected) exposure to other viral communicable diseases: Secondary | ICD-10-CM | POA: Diagnosis not present

## 2020-05-23 DIAGNOSIS — B9789 Other viral agents as the cause of diseases classified elsewhere: Secondary | ICD-10-CM | POA: Diagnosis not present

## 2020-05-23 DIAGNOSIS — R0981 Nasal congestion: Secondary | ICD-10-CM | POA: Diagnosis not present

## 2020-05-24 ENCOUNTER — Ambulatory Visit: Payer: BC Managed Care – PPO | Admitting: Legal Medicine

## 2020-05-30 ENCOUNTER — Other Ambulatory Visit: Payer: Self-pay | Admitting: Legal Medicine

## 2020-05-30 DIAGNOSIS — E782 Mixed hyperlipidemia: Secondary | ICD-10-CM

## 2020-06-27 DIAGNOSIS — E119 Type 2 diabetes mellitus without complications: Secondary | ICD-10-CM | POA: Diagnosis not present

## 2020-06-27 LAB — HM DIABETES EYE EXAM

## 2020-07-01 ENCOUNTER — Encounter: Payer: Self-pay | Admitting: Legal Medicine

## 2020-07-03 ENCOUNTER — Encounter: Payer: Self-pay | Admitting: Legal Medicine

## 2020-07-29 ENCOUNTER — Other Ambulatory Visit: Payer: Self-pay | Admitting: Legal Medicine

## 2020-08-23 ENCOUNTER — Other Ambulatory Visit: Payer: Self-pay | Admitting: Legal Medicine

## 2020-10-01 ENCOUNTER — Encounter: Payer: Self-pay | Admitting: Legal Medicine

## 2020-10-01 ENCOUNTER — Other Ambulatory Visit: Payer: Self-pay

## 2020-10-01 ENCOUNTER — Ambulatory Visit (INDEPENDENT_AMBULATORY_CARE_PROVIDER_SITE_OTHER): Payer: BC Managed Care – PPO | Admitting: Legal Medicine

## 2020-10-01 VITALS — BP 110/80 | HR 82 | Temp 97.8°F | Resp 16 | Ht 64.0 in | Wt 226.0 lb

## 2020-10-01 DIAGNOSIS — E782 Mixed hyperlipidemia: Secondary | ICD-10-CM

## 2020-10-01 DIAGNOSIS — E1169 Type 2 diabetes mellitus with other specified complication: Secondary | ICD-10-CM

## 2020-10-01 LAB — POCT UA - MICROALBUMIN: Microalbumin Ur, POC: 30 mg/L

## 2020-10-01 NOTE — Progress Notes (Signed)
Subjective:  Patient ID: Kelsey Dickerson, female    DOB: 23-Apr-1961  Age: 60 y.o. MRN: [id number]  Chief Complaint  Patient presents with  . Diabetes  . Hyperlipidemia    HPI: chronic visit  Patient present with type 2 diabetes.  Specifically, this is type 2, hyperlipidemia, obesity requiring diabetes, complicated by hyerlipidemia and obesity.  Compliance with treatment has been good; patient take medicines as directed, maintains diet and exercise regimen, follows up as directed, and is keeping glucose diary.  Date of  diagnosis 2010.  Depression screen has been performed.Tobacco screen nonsmoker. Current medicines for diabetes metoprolol.  Patient is on lisnopril for renal protection and simvastatin for cholesterol control.  Patient performs foot exams daily and last ophthalmologic exam was yes.  Patient presents with hyperlipidemia.  Compliance with treatment has been good; patient takes medicines as directed, maintains low cholesterol diet, follows up as directed, and maintains exercise regimen.  Patient is using simvasttin without problems.   Current Outpatient Medications on File Prior to Visit  Medication Sig Dispense Refill  . ACCU-CHEK AVIVA PLUS test strip 1 each by Other route as needed for other. Use as instructed 100 each 6  . Accu-Chek Softclix Lancets lancets 1 each by Other route daily. Use as instructed 100 each 6  . lisinopril (ZESTRIL) 10 MG tablet TAKE 1 TABLET(10 MG) BY MOUTH EVERY DAY 90 tablet 2  . metFORMIN (GLUCOPHAGE) 500 MG tablet TAKE 1 TABLET BY MOUTH TWICE DAILY 60 tablet 6  . simvastatin (ZOCOR) 40 MG tablet TAKE 1 TABLET(40 MG) BY MOUTH DAILY 30 tablet 6   No current facility-administered medications on file prior to visit.   Past Medical History:  Diagnosis Date  . Diabetes mellitus without complication (HCC)   . Hyperlipidemia    Past Surgical History:  Procedure Laterality Date  . COLONOSCOPY  11/03/2019  . FOOT SURGERY Right 04/2017  . HYSTEROSCOPY       Family History  Problem Relation Age of Onset  . Kidney cancer Mother   . Dementia Father    Social History   Socioeconomic History  . Marital status: Married    Spouse name: Not on file  . Number of children: Not on file  . Years of education: Not on file  . Highest education level: Not on file  Occupational History  . Not on file  Tobacco Use  . Smoking status: Never Smoker  . Smokeless tobacco: Never Used  Substance and Sexual Activity  . Alcohol use: Not Currently  . Drug use: Never  . Sexual activity: Yes    Partners: Male  Other Topics Concern  . Not on file  Social History Narrative  . Not on file   Social Determinants of Health   Financial Resource Strain: Not on file  Food Insecurity: Not on file  Transportation Needs: Not on file  Physical Activity: Not on file  Stress: Not on file  Social Connections: Not on file    Review of Systems  Constitutional: Negative for activity change and appetite change.  HENT: Negative.   Eyes: Negative for visual disturbance.  Respiratory: Negative for chest tightness and shortness of breath.   Cardiovascular: Negative for chest pain, palpitations and leg swelling.  Gastrointestinal: Negative for abdominal distention and abdominal pain.  Endocrine: Negative for polyuria.  Genitourinary: Negative for difficulty urinating, dysuria and urgency.  Musculoskeletal: Negative for arthralgias and back pain.  Skin: Negative.   Neurological: Negative.   Psychiatric/Behavioral: Negative.  Objective:  BP 110/80   Pulse 82   Temp 97.8 F (36.6 C)   Resp 16   Ht 5\' 4"  (1.626 m)   Wt 226 lb (102.5 kg)   BMI 38.79 kg/m   BP/Weight 10/01/2020 01/19/2020 09/14/2019  Systolic BP 110 108 120  Diastolic BP 80 70 80  Wt. (Lbs) 226 228 230  BMI 38.79 37.99 40.1    Physical Exam Vitals reviewed.  Constitutional:      General: She is not in acute distress.    Appearance: Normal appearance. She is obese. She is not  ill-appearing.  HENT:     Right Ear: Tympanic membrane normal.     Left Ear: Tympanic membrane normal.     Mouth/Throat:     Mouth: Mucous membranes are moist.     Pharynx: Oropharynx is clear.  Eyes:     Extraocular Movements: Extraocular movements intact.     Conjunctiva/sclera: Conjunctivae normal.     Pupils: Pupils are equal, round, and reactive to light.  Cardiovascular:     Rate and Rhythm: Normal rate and regular rhythm.     Pulses: Normal pulses.     Heart sounds: Normal heart sounds. No murmur heard. No gallop.   Pulmonary:     Effort: Pulmonary effort is normal. No respiratory distress.     Breath sounds: Normal breath sounds. No rales.  Abdominal:     General: Abdomen is flat. Bowel sounds are normal. There is no distension.     Palpations: Abdomen is soft.     Tenderness: There is no abdominal tenderness.  Musculoskeletal:        General: Normal range of motion.  Skin:    General: Skin is dry.     Capillary Refill: Capillary refill takes less than 2 seconds.  Neurological:     General: No focal deficit present.     Mental Status: She is alert and oriented to person, place, and time.  Psychiatric:        Mood and Affect: Mood normal.        Thought Content: Thought content normal.        Judgment: Judgment normal.     Diabetic Foot Exam - Simple   Simple Foot Form Diabetic Foot exam was performed with the following findings: Yes 10/01/2020 10:20 AM  Visual Inspection No deformities, no ulcerations, no other skin breakdown bilaterally: Yes Sensation Testing Intact to touch and monofilament testing bilaterally: Yes Pulse Check Posterior Tibialis and Dorsalis pulse intact bilaterally: Yes Comments      Lab Results  Component Value Date   WBC 4.5 01/19/2020   HGB 13.1 01/19/2020   HCT 39.4 01/19/2020   PLT 232 01/19/2020   GLUCOSE 110 (H) 01/19/2020   CHOL 167 01/19/2020   TRIG 79 01/19/2020   HDL 66 01/19/2020   LDLCALC 86 01/19/2020   ALT 16  01/19/2020   AST 20 01/19/2020   NA 137 01/19/2020   K 4.8 01/19/2020   CL 100 01/19/2020   CREATININE 0.69 01/19/2020   BUN 20 01/19/2020   CO2 24 01/19/2020   TSH 0.706 01/19/2020   HGBA1C 6.0 (H) 01/19/2020   MICROALBUR 30 10/01/2020      Assessment & Plan:   1. Mixed hyperlipidemia AN INDIVIDUAL CARE PLAN for hyperlipidemia/ cholesterol was established and reinforced today.  The patient's status was assessed using clinical findings on exam, lab and other diagnostic tests. The patient's disease status was assessed based on evidence-based guidelines and found to  be fair controlled. MEDICATIONS were reviewed. SELF MANAGEMENT GOALS have been discussed and patient's success at attaining the goal of low cholesterol was assessed. RECOMMENDATION given include regular exercise 3 days a week and low cholesterol/low fat diet. CLINICAL SUMMARY including written plan to identify barriers unique to the patient due to social or economic  reasons was discussed.  2. Type 2 diabetes mellitus with other specified complication, without long-term current use of insulin (HCC) An individual care plan for diabetes was established and reinforced today.  The patient's status was assessed using clinical findings on exam, labs and diagnostic testing. Patient success at meeting goals based on disease specific evidence-based guidelines and found to be fair controlled. Medications were assessed and patient's understanding of the medical issues , including barriers were assessed. Recommend adherence to a diabetic diet, a graduated exercise program, HgbA1c level is checked quarterly, and urine microalbumin performed yearly .  Annual mono-filament sensation testing performed. Lower blood pressure and control hyperlipidemia is important. Get annual eye exams and annual flu shots and smoking cessation discussed.  Self management goals were discussed.  3. Morbid obesity (HCC) An individualize plan was formulated for  obesity using patient history and physical exam to encourage weight loss.  An evidence based program was formulated.  Patient is to cut portion size with meals and to plan physical exercise 3 days a week at least 20 minutes.  Weight watchers and other programs are helpful.  Planned amount of weight loss 10 lbs.         Follow-up: Return in about 4 months (around 01/31/2021) for fasting.  An After Visit Summary was printed and given to the patient.  Brent Bulla, MD [facility] [phone]

## 2020-10-02 LAB — CBC WITH DIFFERENTIAL/PLATELET
Basophils Absolute: 0.1 10*3/uL (ref 0.0–0.2)
Basos: 2 %
EOS (ABSOLUTE): 0.2 10*3/uL (ref 0.0–0.4)
Eos: 3 %
Hematocrit: 40.3 % (ref 34.0–46.6)
Hemoglobin: 13 g/dL (ref 11.1–15.9)
Immature Grans (Abs): 0 10*3/uL (ref 0.0–0.1)
Immature Granulocytes: 0 %
Lymphocytes Absolute: 1.8 10*3/uL (ref 0.7–3.1)
Lymphs: 39 %
MCH: 28.4 pg (ref 26.6–33.0)
MCHC: 32.3 g/dL (ref 31.5–35.7)
MCV: 88 fL (ref 79–97)
Monocytes Absolute: 0.4 10*3/uL (ref 0.1–0.9)
Monocytes: 9 %
Neutrophils Absolute: 2.2 10*3/uL (ref 1.4–7.0)
Neutrophils: 47 %
Platelets: 232 10*3/uL (ref 150–450)
RBC: 4.57 x10E6/uL (ref 3.77–5.28)
RDW: 13.8 % (ref 11.7–15.4)
WBC: 4.6 10*3/uL (ref 3.4–10.8)

## 2020-10-02 LAB — COMPREHENSIVE METABOLIC PANEL
ALT: 17 IU/L (ref 0–32)
AST: 18 IU/L (ref 0–40)
Albumin/Globulin Ratio: 1.7 (ref 1.2–2.2)
Albumin: 4.6 g/dL (ref 3.8–4.9)
Alkaline Phosphatase: 114 IU/L (ref 44–121)
BUN/Creatinine Ratio: 25 — ABNORMAL HIGH (ref 9–23)
BUN: 17 mg/dL (ref 6–24)
Bilirubin Total: 0.3 mg/dL (ref 0.0–1.2)
CO2: 22 mmol/L (ref 20–29)
Calcium: 9.6 mg/dL (ref 8.7–10.2)
Chloride: 100 mmol/L (ref 96–106)
Creatinine, Ser: 0.67 mg/dL (ref 0.57–1.00)
Globulin, Total: 2.7 g/dL (ref 1.5–4.5)
Glucose: 101 mg/dL — ABNORMAL HIGH (ref 65–99)
Potassium: 5.2 mmol/L (ref 3.5–5.2)
Sodium: 140 mmol/L (ref 134–144)
Total Protein: 7.3 g/dL (ref 6.0–8.5)
eGFR: 101 mL/min/{1.73_m2} (ref >59)

## 2020-10-02 LAB — LIPID PANEL
Chol/HDL Ratio: 2.7 ratio (ref 0.0–4.4)
Cholesterol, Total: 180 mg/dL (ref 100–199)
HDL: 67 mg/dL (ref >39)
LDL Chol Calc (NIH): 87 mg/dL (ref 0–99)
Triglycerides: 154 mg/dL — ABNORMAL HIGH (ref 0–149)
VLDL Cholesterol Cal: 26 mg/dL (ref 5–40)

## 2020-10-02 LAB — HEMOGLOBIN A1C
Est. average glucose Bld gHb Est-mCnc: 123 mg/dL
Hgb A1c MFr Bld: 5.9 % — ABNORMAL HIGH (ref 4.8–5.6)

## 2020-10-02 LAB — CARDIOVASCULAR RISK ASSESSMENT

## 2020-10-02 NOTE — Progress Notes (Signed)
CBC normal, glucose 101, kidney tests ok, liver tests ok, A1c 5.9 , triglycerides high 154 lp

## 2020-11-06 ENCOUNTER — Encounter: Payer: Self-pay | Admitting: Legal Medicine

## 2020-12-26 ENCOUNTER — Other Ambulatory Visit: Payer: Self-pay | Admitting: Legal Medicine

## 2020-12-26 DIAGNOSIS — E782 Mixed hyperlipidemia: Secondary | ICD-10-CM

## 2021-02-03 ENCOUNTER — Other Ambulatory Visit: Payer: Self-pay

## 2021-02-03 ENCOUNTER — Ambulatory Visit (INDEPENDENT_AMBULATORY_CARE_PROVIDER_SITE_OTHER): Payer: BC Managed Care – PPO | Admitting: Legal Medicine

## 2021-02-03 ENCOUNTER — Encounter: Payer: Self-pay | Admitting: Legal Medicine

## 2021-02-03 VITALS — BP 124/72 | HR 65 | Temp 97.0°F | Ht 64.0 in | Wt 227.7 lb

## 2021-02-03 DIAGNOSIS — E1169 Type 2 diabetes mellitus with other specified complication: Secondary | ICD-10-CM | POA: Diagnosis not present

## 2021-02-03 DIAGNOSIS — Z6837 Body mass index (BMI) 37.0-37.9, adult: Secondary | ICD-10-CM

## 2021-02-03 DIAGNOSIS — E782 Mixed hyperlipidemia: Secondary | ICD-10-CM

## 2021-02-03 DIAGNOSIS — I152 Hypertension secondary to endocrine disorders: Secondary | ICD-10-CM

## 2021-02-03 DIAGNOSIS — E669 Obesity, unspecified: Secondary | ICD-10-CM

## 2021-02-03 DIAGNOSIS — N951 Menopausal and female climacteric states: Secondary | ICD-10-CM

## 2021-02-03 DIAGNOSIS — E1159 Type 2 diabetes mellitus with other circulatory complications: Secondary | ICD-10-CM

## 2021-02-03 NOTE — Progress Notes (Signed)
Established Patient Office Visit  Subjective:  Patient ID: Kelsey Dickerson, female    DOB: 1961/02/06  Age: 60 y.o. MRN: [id number]  CC:  Chief Complaint  Patient presents with   Hyperlipidemia   Diabetes    HPI Kelsey Dickerson presents for chronic visit  Patient present with type 2 diabetes.  Specifically, this is type 2, insulin requiring diabetes, complicated by hyperlipidemia and obesity.  Compliance with treatment has been good; patient take medicines as directed, maintains diet and exercise regimen, follows up as directed, and is keeping glucose diary.  Date of  diagnosis 2010.  Depression screen has been performed.Tobacco screen nonsmoker. Current medicines for diabetes metformin.  Patient is on lisinopril for renal protection and simvastatin for cholesterol control.  Patient performs foot exams daily and last ophthalmologic exam was yes.   Patient presents with hyperlipidemia.  Compliance with treatment has been good; patient takes medicines as directed, maintains low cholesterol diet, follows up as directed, and maintains exercise regimen.  Patient is using simvastatin without problems.   Past Medical History:  Diagnosis Date   Diabetes mellitus without complication ([place])    Hyperlipidemia     Past Surgical History:  Procedure Laterality Date   COLONOSCOPY  11/03/2019   FOOT SURGERY Right 04/2017   HYSTEROSCOPY      Family History  Problem Relation Age of Onset   Kidney cancer Mother    Dementia Father     Social History   Socioeconomic History   Marital status: Married    Spouse name: Not on file   Number of children: Not on file   Years of education: Not on file   Highest education level: Not on file  Occupational History   Not on file  Tobacco Use   Smoking status: Never   Smokeless tobacco: Never  Substance and Sexual Activity   Alcohol use: Not Currently   Drug use: Never   Sexual activity: Yes    Partners: Male  Other Topics Concern   Not on file   Social History Narrative   Not on file   Social Determinants of Health   Financial Resource Strain: Not on file  Food Insecurity: Not on file  Transportation Needs: Not on file  Physical Activity: Not on file  Stress: Not on file  Social Connections: Not on file  Intimate Partner Violence: Not on file    Outpatient Medications Prior to Visit  Medication Sig Dispense Refill   ACCU-CHEK AVIVA PLUS test strip 1 each by Other route as needed for other. Use as instructed 100 each 6   Accu-Chek Softclix Lancets lancets 1 each by Other route daily. Use as instructed 100 each 6   lisinopril (ZESTRIL) 10 MG tablet TAKE 1 TABLET(10 MG) BY MOUTH EVERY DAY 90 tablet 2   metFORMIN (GLUCOPHAGE) 500 MG tablet TAKE 1 TABLET BY MOUTH TWICE DAILY 60 tablet 6   simvastatin (ZOCOR) 40 MG tablet TAKE 1 TABLET(40 MG) BY MOUTH DAILY 90 tablet 0   No facility-administered medications prior to visit.    Allergies  Allergen Reactions   Oxycodone    Penicillins     ROS Review of Systems  Constitutional:  Negative for activity change and appetite change.  HENT:  Negative for congestion.   Eyes:  Negative for visual disturbance.  Respiratory:  Negative for chest tightness and shortness of breath.   Cardiovascular:  Negative for chest pain and palpitations.  Gastrointestinal:  Negative for abdominal distention.  Endocrine: Negative for polyuria.  Genitourinary:  Negative for difficulty urinating and dysuria.  Musculoskeletal:  Negative for arthralgias and back pain.  Neurological: Negative.   Psychiatric/Behavioral: Negative.       Objective:    Physical Exam Vitals reviewed.  Constitutional:      Appearance: Normal appearance. She is obese.  HENT:     Right Ear: Tympanic membrane normal.     Left Ear: Tympanic membrane normal.     Nose: Nose normal.     Mouth/Throat:     Mouth: Mucous membranes are moist.     Pharynx: Oropharynx is clear.  Eyes:     Extraocular Movements: Extraocular  movements intact.     Conjunctiva/sclera: Conjunctivae normal.     Pupils: Pupils are equal, round, and reactive to light.  Cardiovascular:     Rate and Rhythm: Normal rate and regular rhythm.     Pulses: Normal pulses.     Heart sounds: Normal heart sounds. No murmur heard.   No gallop.  Pulmonary:     Effort: Pulmonary effort is normal. No respiratory distress.     Breath sounds: No wheezing.  Abdominal:     General: Abdomen is flat. Bowel sounds are normal. There is no distension.     Tenderness: There is no abdominal tenderness.  Musculoskeletal:        General: Normal range of motion.     Cervical back: Normal range of motion and neck supple.  Skin:    General: Skin is warm.     Capillary Refill: Capillary refill takes less than 2 seconds.  Neurological:     General: No focal deficit present.     Mental Status: She is alert and oriented to person, place, and time. Mental status is at baseline.    BP 124/72   Pulse 65   Temp (!) 97 F (36.1 C)   Ht '5\' 4"'  (1.626 m)   Wt 227 lb 11.2 oz (103.3 kg)   BMI 39.08 kg/m  Wt Readings from Last 3 Encounters:  02/03/21 227 lb 11.2 oz (103.3 kg)  10/01/20 226 lb (102.5 kg)  01/19/20 (!) 228 lb (103.4 kg)     Health Maintenance Due  Topic Date Due   PNEUMOCOCCAL POLYSACCHARIDE VACCINE AGE 61-64 HIGH RISK  Never done   HIV Screening  Never done   Hepatitis C Screening  Never done   TETANUS/TDAP  Never done   PAP SMEAR-Modifier  Never done   Zoster Vaccines- Shingrix (1 of 2) Never done   COVID-19 Vaccine (2 - Booster for Janssen series) 07/25/2020   INFLUENZA VACCINE  01/20/2021    There are no preventive care reminders to display for this patient.  Lab Results  Component Value Date   TSH 0.706 01/19/2020   Lab Results  Component Value Date   WBC 4.6 10/01/2020   HGB 13.0 10/01/2020   HCT 40.3 10/01/2020   MCV 88 10/01/2020   PLT 232 10/01/2020   Lab Results  Component Value Date   NA 140 10/01/2020   K 5.2  10/01/2020   CO2 22 10/01/2020   GLUCOSE 101 (H) 10/01/2020   BUN 17 10/01/2020   CREATININE 0.67 10/01/2020   BILITOT 0.3 10/01/2020   ALKPHOS 114 10/01/2020   AST 18 10/01/2020   ALT 17 10/01/2020   PROT 7.3 10/01/2020   ALBUMIN 4.6 10/01/2020   CALCIUM 9.6 10/01/2020   EGFR 101 10/01/2020   Lab Results  Component Value Date   CHOL 180 10/01/2020   Lab Results  Component Value Date   HDL 67 10/01/2020   Lab Results  Component Value Date   LDLCALC 87 10/01/2020   Lab Results  Component Value Date   TRIG 154 (H) 10/01/2020   Lab Results  Component Value Date   CHOLHDL 2.7 10/01/2020   Lab Results  Component Value Date   HGBA1C 5.9 (H) 10/01/2020      Assessment & Plan:   Problem List Items Addressed This Visit       Cardiovascular and Mediastinum   Obesity, diabetes, and hypertension syndrome ([place]) An individual care plan for diabetes was established and reinforced today.  The patient's status was assessed using clinical findings on exam, labs and diagnostic testing. Patient success at meeting goals based on disease specific evidence-based guidelines and found to be good controlled. Medications were assessed and patient's understanding of the medical issues , including barriers were assessed. Recommend adherence to a diabetic diet, a graduated exercise program, HgbA1c level is checked quarterly, and urine microalbumin performed yearly .  Annual mono-filament sensation testing performed. Lower blood pressure and control hyperlipidemia is important. Get annual eye exams and annual flu shots and smoking cessation discussed.  Self management goals were discussed.      Other   Mixed hyperlipidemia - Primary   Relevant Orders   Comprehensive metabolic panel   Lipid panel AN INDIVIDUAL CARE PLAN for hyperlipidemia/ cholesterol was established and reinforced today.  The patient's status was assessed using clinical findings on exam, lab and other diagnostic tests. The  patient's disease status was assessed based on evidence-based guidelines and found to be fair controlled. MEDICATIONS were reviewed. SELF MANAGEMENT GOALS have been discussed and patient's success at attaining the goal of low cholesterol was assessed. RECOMMENDATION given include regular exercise 3 days a week and low cholesterol/low fat diet. CLINICAL SUMMARY including written plan to identify barriers unique to the patient due to social or economic  reasons was discussed.     Menopausal state   Relevant Orders   MM Digital Screening Patient needs mammogram     BMI 37.0-37.9, adult An individualize plan was formulated for obesity using patient history and physical exam to encourage weight loss.  An evidence based program was formulated.  Patient is to cut portion size  with meals and to plan physical exercise 3 days a week at least 20 minutes.  Weight watchers and other programs are helpful.  Planned amount of weight loss 10 lbs.  With diabetes and hypercholesterolemia, she meets criteria for morbid obesity    Morbid obesity ([place]) An individualize plan was formulated for obesity using patient history and physical exam to encourage weight loss.  An evidence based program was formulated.  Patient is to cut portion size with meals and to plan physical exercise 3 days a week at least 20 minutes.  Weight watchers and other programs are helpful.  Planned amount of weight loss 10 lbs.    Other Visit Diagnoses     Type 2 diabetes mellitus with other specified complication, without long-term current use of insulin ([place])       Relevant Orders   CBC with Differential   Comprehensive metabolic panel An individual care plan for diabetes was established and reinforced today.  The patient's status was assessed using clinical findings on exam, labs and diagnostic testing. Patient success at meeting goals based on disease specific evidence-based guidelines and found to be good controlled. Medications were  assessed and patient's understanding of the medical issues , including barriers were  assessed. Recommend adherence to a diabetic diet, a graduated exercise program, HgbA1c level is checked quarterly, and urine microalbumin performed yearly .  Annual mono-filament sensation testing performed. Lower blood pressure and control hyperlipidemia is important. Get annual eye exams and annual flu shots and smoking cessation discussed.  Self management goals were discussed.       30 minute visit with review of old records    Follow-up: Return in about 4 months (around 06/05/2021) for fasting.    Reinaldo Meeker, MD

## 2021-02-04 LAB — CBC WITH DIFFERENTIAL/PLATELET
Basophils Absolute: 0.1 10*3/uL (ref 0.0–0.2)
Basos: 1 %
EOS (ABSOLUTE): 0.2 10*3/uL (ref 0.0–0.4)
Eos: 6 %
Hematocrit: 41.8 % (ref 34.0–46.6)
Hemoglobin: 13.2 g/dL (ref 11.1–15.9)
Immature Grans (Abs): 0 10*3/uL (ref 0.0–0.1)
Immature Granulocytes: 0 %
Lymphocytes Absolute: 1.5 10*3/uL (ref 0.7–3.1)
Lymphs: 36 %
MCH: 28.1 pg (ref 26.6–33.0)
MCHC: 31.6 g/dL (ref 31.5–35.7)
MCV: 89 fL (ref 79–97)
Monocytes Absolute: 0.3 10*3/uL (ref 0.1–0.9)
Monocytes: 8 %
Neutrophils Absolute: 2.1 10*3/uL (ref 1.4–7.0)
Neutrophils: 49 %
Platelets: 240 10*3/uL (ref 150–450)
RBC: 4.7 x10E6/uL (ref 3.77–5.28)
RDW: 14.5 % (ref 11.7–15.4)
WBC: 4.2 10*3/uL (ref 3.4–10.8)

## 2021-02-04 LAB — COMPREHENSIVE METABOLIC PANEL
ALT: 17 IU/L (ref 0–32)
AST: 18 IU/L (ref 0–40)
Albumin/Globulin Ratio: 1.8 (ref 1.2–2.2)
Albumin: 4.5 g/dL (ref 3.8–4.9)
Alkaline Phosphatase: 111 IU/L (ref 44–121)
BUN/Creatinine Ratio: 28 — ABNORMAL HIGH (ref 9–23)
BUN: 18 mg/dL (ref 6–24)
Bilirubin Total: 0.3 mg/dL (ref 0.0–1.2)
CO2: 25 mmol/L (ref 20–29)
Calcium: 9.7 mg/dL (ref 8.7–10.2)
Chloride: 102 mmol/L (ref 96–106)
Creatinine, Ser: 0.64 mg/dL (ref 0.57–1.00)
Globulin, Total: 2.5 g/dL (ref 1.5–4.5)
Glucose: 109 mg/dL — ABNORMAL HIGH (ref 65–99)
Potassium: 4.8 mmol/L (ref 3.5–5.2)
Sodium: 142 mmol/L (ref 134–144)
Total Protein: 7 g/dL (ref 6.0–8.5)
eGFR: 102 mL/min/{1.73_m2} (ref >59)

## 2021-02-04 LAB — LIPID PANEL
Chol/HDL Ratio: 2.7 ratio (ref 0.0–4.4)
Cholesterol, Total: 174 mg/dL (ref 100–199)
HDL: 65 mg/dL (ref >39)
LDL Chol Calc (NIH): 88 mg/dL (ref 0–99)
Triglycerides: 122 mg/dL (ref 0–149)
VLDL Cholesterol Cal: 21 mg/dL (ref 5–40)

## 2021-02-04 LAB — CARDIOVASCULAR RISK ASSESSMENT

## 2021-02-04 NOTE — Progress Notes (Signed)
CBC normal, glucose 109, kidney tests normal, liver tests normal, triglycerides high 154,  lp

## 2021-02-17 ENCOUNTER — Other Ambulatory Visit: Payer: Self-pay

## 2021-02-17 ENCOUNTER — Ambulatory Visit
Admission: RE | Admit: 2021-02-17 | Discharge: 2021-02-17 | Disposition: A | Discharge status: Home / Self Care | Payer: BC Managed Care – PPO | Source: Ambulatory Visit | Attending: Legal Medicine | Admitting: Legal Medicine

## 2021-02-17 ENCOUNTER — Other Ambulatory Visit: Payer: Self-pay | Admitting: Legal Medicine

## 2021-02-17 DIAGNOSIS — Z1231 Encounter for screening mammogram for malignant neoplasm of breast: Secondary | ICD-10-CM | POA: Diagnosis not present

## 2021-02-17 DIAGNOSIS — N951 Menopausal and female climacteric states: Secondary | ICD-10-CM

## 2021-02-23 NOTE — Progress Notes (Signed)
Mammogram normal lp

## 2021-03-26 ENCOUNTER — Other Ambulatory Visit: Payer: Self-pay | Admitting: Family Medicine

## 2021-03-26 ENCOUNTER — Other Ambulatory Visit: Payer: Self-pay | Admitting: Legal Medicine

## 2021-03-26 DIAGNOSIS — E782 Mixed hyperlipidemia: Secondary | ICD-10-CM

## 2021-04-25 ENCOUNTER — Other Ambulatory Visit: Payer: Self-pay | Admitting: Legal Medicine

## 2021-06-06 ENCOUNTER — Ambulatory Visit: Payer: BC Managed Care – PPO | Admitting: Legal Medicine

## 2021-06-27 ENCOUNTER — Ambulatory Visit: Payer: 59 | Admitting: Legal Medicine

## 2021-06-27 ENCOUNTER — Other Ambulatory Visit: Payer: Self-pay

## 2021-06-27 ENCOUNTER — Encounter: Payer: Self-pay | Admitting: Legal Medicine

## 2021-06-27 VITALS — BP 110/70 | HR 120 | Temp 97.9°F | Resp 16 | Ht 64.0 in | Wt 224.0 lb

## 2021-06-27 DIAGNOSIS — E782 Mixed hyperlipidemia: Secondary | ICD-10-CM | POA: Diagnosis not present

## 2021-06-27 DIAGNOSIS — E1159 Type 2 diabetes mellitus with other circulatory complications: Secondary | ICD-10-CM

## 2021-06-27 DIAGNOSIS — E669 Obesity, unspecified: Secondary | ICD-10-CM

## 2021-06-27 DIAGNOSIS — E1169 Type 2 diabetes mellitus with other specified complication: Secondary | ICD-10-CM | POA: Diagnosis not present

## 2021-06-27 DIAGNOSIS — I152 Hypertension secondary to endocrine disorders: Secondary | ICD-10-CM

## 2021-06-27 LAB — POCT UA - MICROALBUMIN: Microalbumin Ur, POC: 80 mg/L

## 2021-06-27 LAB — HEMOGLOBIN A1C: A1c: 5.9

## 2021-06-27 MED ORDER — SIMVASTATIN 40 MG PO TABS: 40.0000 mg | ORAL_TABLET | Freq: Every day | ORAL | 2 refills | Status: DC

## 2021-06-27 MED ORDER — METFORMIN HCL 500 MG PO TABS: 500.0000 mg | ORAL_TABLET | Freq: Two times a day (BID) | ORAL | 3 refills | Status: DC

## 2021-06-27 MED ORDER — LISINOPRIL 10 MG PO TABS: 10.0000 mg | ORAL_TABLET | Freq: Every day | ORAL | 2 refills | Status: DC

## 2021-06-27 NOTE — Progress Notes (Signed)
Subjective:  Patient ID: Kelsey Dickerson, female    DOB: [birth year]  Age: 61 y.o. MRN: [id number]  Chief Complaint  Patient presents with   Diabetes   Hyperlipidemia    HPI Diabetes: Patient mentioned that her blood sugar has been jumped from 95 to 125 mg/dl. Patient checks blood sugar daily and feet daily. She is taking metformin 500 mg BID. She was diagnosed 10 years ago. Patient denied numbness on the legs.  She remains on an ACE inhibitor, lisinopril and a statin, simvastatin.  Hyperlipidemia: She takes simvastatin 40 mg daily. She is trying to eat healthy and denied exercise lately.     Current Outpatient Medications on File Prior to Visit  Medication Sig Dispense Refill   ACCU-CHEK AVIVA PLUS test strip 1 each by Other route as needed for other. Use as instructed 100 each 6   Accu-Chek Softclix Lancets lancets 1 each by Other route daily. Use as instructed 100 each 6   No current facility-administered medications on file prior to visit.   Past Medical History:  Diagnosis Date   Diabetes mellitus without complication ([place])    Hyperlipidemia    Past Surgical History:  Procedure Laterality Date   COLONOSCOPY  11/03/2019   FOOT SURGERY Right 04/2017   HYSTEROSCOPY      Family History  Problem Relation Age of Onset   Kidney cancer Mother    Dementia Father    Social History   Socioeconomic History   Marital status: Married    Spouse name: Not on file   Number of children: Not on file   Years of education: Not on file   Highest education level: Not on file  Occupational History   Not on file  Tobacco Use   Smoking status: Never   Smokeless tobacco: Never  Substance and Sexual Activity   Alcohol use: Not Currently   Drug use: Never   Sexual activity: Yes    Partners: Male  Other Topics Concern   Not on file  Social History Narrative   Not on file   Social Determinants of Health   Financial Resource Strain: Not on file  Food Insecurity: Not on file   Transportation Needs: Not on file  Physical Activity: Not on file  Stress: Not on file  Social Connections: Not on file    Review of Systems  Constitutional:  Negative for chills, fatigue and fever.  HENT:  Negative for congestion, ear pain and sore throat.   Respiratory:  Negative for cough and shortness of breath.   Cardiovascular:  Negative for chest pain and palpitations.  Gastrointestinal:  Negative for abdominal pain, constipation, diarrhea, nausea and vomiting.  Endocrine: Negative for polydipsia, polyphagia and polyuria.  Genitourinary:  Negative for difficulty urinating and dysuria.  Musculoskeletal:  Negative for arthralgias, back pain and myalgias.  Skin:  Negative for rash.  Neurological:  Negative for headaches.  Psychiatric/Behavioral:  Negative for dysphoric mood. The patient is not nervous/anxious.     Objective:  BP 110/70    Pulse (!) 120    Temp 97.9 F (36.6 C)    Resp 16    Ht 5\' 4"  (1.626 m)    Wt 224 lb (101.6 kg)    SpO2 99%    BMI 38.45 kg/m   BP/Weight 06/27/2021 02/03/2021 [id number]  Systolic BP A999333 [id number]  Diastolic BP 70 72 80  Wt. (Lbs) 224 227.7 226  BMI 38.45 39.08 38.79    Physical Exam Constitutional:  Appearance: Normal appearance. She is obese.  HENT:     Head: Normocephalic.     Right Ear: Tympanic membrane, ear canal and external ear normal.     Left Ear: Tympanic membrane, ear canal and external ear normal.     Nose: Nose normal.     Mouth/Throat:     Mouth: Mucous membranes are moist.     Pharynx: Oropharynx is clear. No posterior oropharyngeal erythema.  Eyes:     Extraocular Movements: Extraocular movements intact.     Conjunctiva/sclera: Conjunctivae normal.     Pupils: Pupils are equal, round, and reactive to light.  Neck:     Vascular: No carotid bruit.  Cardiovascular:     Rate and Rhythm: Normal rate and regular rhythm.     Pulses: Normal pulses.     Heart sounds: No murmur heard. Pulmonary:     Effort: Pulmonary  effort is normal.     Breath sounds: Normal breath sounds.  Abdominal:     General: Bowel sounds are normal.     Palpations: Abdomen is soft. There is no mass.  Musculoskeletal:        General: Normal range of motion.     Cervical back: Normal range of motion. No tenderness.     Right lower leg: No edema.     Left lower leg: No edema.  Neurological:     Gait: Gait normal.     Deep Tendon Reflexes: Reflexes normal.  Psychiatric:        Mood and Affect: Mood normal.        Behavior: Behavior normal.        Thought Content: Thought content normal.    Diabetic Foot Exam - Simple   Simple Foot Form Visual Inspection No deformities, no ulcerations, no other skin breakdown bilaterally: Yes See comments: Yes Sensation Testing Intact to touch and monofilament testing bilaterally: Yes Pulse Check Posterior Tibialis and Dorsalis pulse intact bilaterally: Yes Comments Small bunion on the left foot. She has a scare of surgery on right 1st toe, they fix a hammer toe.      Lab Results  Component Value Date   WBC 4.2 02/03/2021   HGB 13.2 02/03/2021   HCT 41.8 02/03/2021   PLT 240 02/03/2021   GLUCOSE 109 (H) 02/03/2021   CHOL 174 02/03/2021   TRIG 122 02/03/2021   HDL 65 02/03/2021   LDLCALC 88 02/03/2021   ALT 17 02/03/2021   AST 18 02/03/2021   NA 142 02/03/2021   K 4.8 02/03/2021   CL 102 02/03/2021   CREATININE 0.64 02/03/2021   BUN 18 02/03/2021   CO2 25 02/03/2021   TSH 0.706 01/19/2020   HGBA1C 5.9 (H) 10/01/2020   MICROALBUR 80 06/27/2021      Assessment & Plan:   Problem List Items Addressed This Visit       Cardiovascular and Mediastinum   Obesity, diabetes, and hypertension syndrome ([place]) - Primary   Relevant Medications   simvastatin (ZOCOR) 40 MG tablet   metFORMIN (GLUCOPHAGE) 500 MG tablet   lisinopril (ZESTRIL) 10 MG tablet   Other Relevant Orders   Comprehensive metabolic panel   Hemoglobin A1c   POCT UA - Microalbumin (Completed)   CBC  with Differential/Platelet An individual care plan for diabetes was established and reinforced today.  The patient's status was assessed using clinical findings on exam, labs and diagnostic testing. Patient success at meeting goals based on disease specific evidence-based guidelines and found to be good controlled.  Medications were assessed and patient's understanding of the medical issues , including barriers were assessed. Recommend adherence to a diabetic diet, a graduated exercise program, HgbA1c level is checked quarterly, and urine microalbumin performed yearly .  Annual mono-filament sensation testing performed. Lower blood pressure and control hyperlipidemia is important. Get annual eye exams and annual flu shots and smoking cessation discussed.  Self management goals were discussed.      Other   Mixed hyperlipidemia   Relevant Medications   simvastatin (ZOCOR) 40 MG tablet   lisinopril (ZESTRIL) 10 MG tablet   Other Relevant Orders   Lipid panel AN INDIVIDUAL CARE PLAN for hyperlipidemia/ cholesterol was established and reinforced today.  The patient's status was assessed using clinical findings on exam, lab and other diagnostic tests. The patient's disease status was assessed based on evidence-based guidelines and found to be well controlled. MEDICATIONS were reviewed. SELF MANAGEMENT GOALS have been discussed and patient's success at attaining the goal of low cholesterol was assessed. RECOMMENDATION given include regular exercise 3 days a week and low cholesterol/low fat diet. CLINICAL SUMMARY including written plan to identify barriers unique to the patient due to social or economic  reasons was discussed.    Morbid obesity ([place])   Relevant Medications   metFORMIN (GLUCOPHAGE) 500 MG tablet An individualize plan was formulated for obesity using patient history and physical exam to encourage weight loss.  An evidence based program was formulated.  Patient is to cut portion size with  meals and to plan physical exercise 3 days a week at least 20 minutes.  Weight watchers and other programs are helpful.  Planned amount of weight loss 10 lbs.   .  Meds ordered this encounter  Medications   simvastatin (ZOCOR) 40 MG tablet    Sig: Take 1 tablet (40 mg total) by mouth daily at 6 PM.    Dispense:  30 tablet    Refill:  2   metFORMIN (GLUCOPHAGE) 500 MG tablet    Sig: Take 1 tablet (500 mg total) by mouth 2 (two) times daily.    Dispense:  60 tablet    Refill:  3   lisinopril (ZESTRIL) 10 MG tablet    Sig: Take 1 tablet (10 mg total) by mouth daily.    Dispense:  30 tablet    Refill:  2    Orders Placed This Encounter  Procedures   Comprehensive metabolic panel   Hemoglobin A1c   Lipid panel   CBC with Differential/Platelet   POCT UA - Microalbumin    I,Savino Whisenant,acting as a scribe for Reinaldo Meeker, MD.,have documented all relevant documentation on the behalf of Reinaldo Meeker, MD,as directed by  Reinaldo Meeker, MD while in the presence of Reinaldo Meeker, MD.   Follow-up: Return in about 4 months (around 10/25/2021). 30 minute visit with review of old records An After Visit Summary was printed and given to the patient.  Reinaldo Meeker, MD [facility] [phone]

## 2021-07-02 LAB — LIPID PANEL
Cholesterol, Total: 162
HDL: 60
LDL: 73.8
Triglycerides: 141 (ref 40–160)
VLDL: 28.2 mg/dL

## 2021-09-08 ENCOUNTER — Other Ambulatory Visit: Payer: Self-pay | Admitting: Legal Medicine

## 2021-09-08 DIAGNOSIS — E669 Obesity, unspecified: Secondary | ICD-10-CM

## 2021-09-08 DIAGNOSIS — E782 Mixed hyperlipidemia: Secondary | ICD-10-CM

## 2021-10-13 ENCOUNTER — Other Ambulatory Visit: Payer: Self-pay | Admitting: Legal Medicine

## 2021-10-13 DIAGNOSIS — E1159 Type 2 diabetes mellitus with other circulatory complications: Secondary | ICD-10-CM

## 2021-10-13 NOTE — Telephone Encounter (Signed)
Refill sent to pharmacy.   

## 2021-11-05 ENCOUNTER — Ambulatory Visit: Payer: 59 | Admitting: Legal Medicine

## 2021-11-16 NOTE — Progress Notes (Unsigned)
Subjective:  Patient ID: Kelsey Dickerson, female    DOB: 10-25-1960  Age: 61 y.o. MRN: [id number]  No chief complaint on file.   HPI Patient presents with hyperlipidemia.  Compliance with treatment has been good; patient takes medicines as directed, maintains low cholesterol diet, follows up as directed, and maintains exercise regimen.  Patient is using Simvastatin 40 mg daily without problems.   Patient present with type 2 diabetes. Compliance with treatment has been good; patient take medicines as directed, maintains diet and exercise regimen, follows up as directed, and is keeping glucose diary.  Date of  diagnosis { }.  Current medicines for diabetes Metformin 500 mg daily. Patient performs foot exams daily and last ophthalmologic exam was 11/2020. Last A1C 5.9%  Patient presents for follow up of hypertension.  Patient tolerating Lisinopril 10 mg daily well without side effects.  Patient was diagnosed with hypertension and has been treated for hypertension for { } years.Patient is working on maintaining diet and exercise regimen and follows up as directed.  Current Outpatient Medications on File Prior to Visit  Medication Sig Dispense Refill   ACCU-CHEK AVIVA PLUS test strip 1 each by Other route as needed for other. Use as instructed 100 each 6   Accu-Chek Softclix Lancets lancets 1 each by Other route daily. Use as instructed 100 each 6   lisinopril (ZESTRIL) 10 MG tablet TAKE 1 TABLET BY MOUTH DAILY. 90 tablet 2   metFORMIN (GLUCOPHAGE) 500 MG tablet TAKE 1 TABLET BY MOUTH 2 TIMES DAILY. 60 tablet 2   simvastatin (ZOCOR) 40 MG tablet TAKE 1 TABLET BY MOUTH DAILY AT 6 PM. 90 tablet 2   No current facility-administered medications on file prior to visit.   Past Medical History:  Diagnosis Date   Diabetes mellitus without complication ([place])    Hyperlipidemia    Past Surgical History:  Procedure Laterality Date   COLONOSCOPY  11/03/2019   FOOT SURGERY Right 04/2017   HYSTEROSCOPY       Family History  Problem Relation Age of Onset   Kidney cancer Mother    Dementia Father    Social History   Socioeconomic History   Marital status: Married    Spouse name: Not on file   Number of children: Not on file   Years of education: Not on file   Highest education level: Not on file  Occupational History   Not on file  Tobacco Use   Smoking status: Never   Smokeless tobacco: Never  Substance and Sexual Activity   Alcohol use: Not Currently   Drug use: Never   Sexual activity: Yes    Partners: Male  Other Topics Concern   Not on file  Social History Narrative   Not on file   Social Determinants of Health   Financial Resource Strain: Not on file  Food Insecurity: Not on file  Transportation Needs: Not on file  Physical Activity: Not on file  Stress: Not on file  Social Connections: Not on file    Review of Systems   Objective:  There were no vitals taken for this visit.     06/27/2021    8:21 AM 02/03/2021    7:42 AM 10/01/2020    9:57 AM  BP/Weight  Systolic BP A999333 A999333 A999333  Diastolic BP 70 72 80  Wt. (Lbs) 224 227.7 226  BMI 38.45 kg/m2 39.08 kg/m2 38.79 kg/m2    Physical Exam  Diabetic Foot Exam - Simple   No data filed  Lab Results  Component Value Date   WBC 4.2 02/03/2021   HGB 13.2 02/03/2021   HCT 41.8 02/03/2021   PLT 240 02/03/2021   GLUCOSE 109 (H) 02/03/2021   CHOL 162 06/27/2021   TRIG 141 06/27/2021   HDL 65 02/03/2021   LDLCALC 88 02/03/2021   ALT 17 02/03/2021   AST 18 02/03/2021   NA 142 02/03/2021   K 4.8 02/03/2021   CL 102 02/03/2021   CREATININE 0.64 02/03/2021   BUN 18 02/03/2021   CO2 25 02/03/2021   TSH 0.706 01/19/2020   HGBA1C 5.9 (H) 10/01/2020   MICROALBUR 80 06/27/2021      Assessment & Plan:   Problem List Items Addressed This Visit       Cardiovascular and Mediastinum   Obesity, diabetes, and hypertension syndrome ([place]) - Primary     Other   Mixed hyperlipidemia   Menopausal state   .  No orders of the defined types were placed in this encounter.   No orders of the defined types were placed in this encounter.    Follow-up: No follow-ups on file.  An After Visit Summary was printed and given to the patient.  Reinaldo Meeker, MD [facility] [phone]

## 2021-11-18 ENCOUNTER — Encounter: Payer: Self-pay | Admitting: Legal Medicine

## 2021-11-18 ENCOUNTER — Ambulatory Visit (INDEPENDENT_AMBULATORY_CARE_PROVIDER_SITE_OTHER): Payer: Commercial Managed Care - PPO | Admitting: Legal Medicine

## 2021-11-18 VITALS — BP 90/68 | HR 95 | Temp 98.9°F | Resp 15 | Ht 64.0 in | Wt 218.0 lb

## 2021-11-18 DIAGNOSIS — N951 Menopausal and female climacteric states: Secondary | ICD-10-CM | POA: Diagnosis not present

## 2021-11-18 DIAGNOSIS — E782 Mixed hyperlipidemia: Secondary | ICD-10-CM | POA: Diagnosis not present

## 2021-11-18 DIAGNOSIS — I152 Hypertension secondary to endocrine disorders: Secondary | ICD-10-CM

## 2021-11-18 DIAGNOSIS — Z1159 Encounter for screening for other viral diseases: Secondary | ICD-10-CM

## 2021-11-18 DIAGNOSIS — E119 Type 2 diabetes mellitus without complications: Secondary | ICD-10-CM

## 2021-11-18 DIAGNOSIS — Z6837 Body mass index (BMI) 37.0-37.9, adult: Secondary | ICD-10-CM

## 2021-11-18 DIAGNOSIS — E1159 Type 2 diabetes mellitus with other circulatory complications: Secondary | ICD-10-CM

## 2021-11-18 DIAGNOSIS — E1169 Type 2 diabetes mellitus with other specified complication: Secondary | ICD-10-CM

## 2021-11-18 DIAGNOSIS — E669 Obesity, unspecified: Secondary | ICD-10-CM

## 2021-11-18 DIAGNOSIS — Z114 Encounter for screening for human immunodeficiency virus [HIV]: Secondary | ICD-10-CM

## 2021-11-18 MED ORDER — METFORMIN HCL 500 MG PO TABS: 500.0000 mg | ORAL_TABLET | Freq: Two times a day (BID) | ORAL | 2 refills | Status: DC

## 2021-11-19 LAB — MICROALBUMIN / CREATININE URINE RATIO
Creatinine, Urine: 124.7 mg/dL
Microalb/Creat Ratio: 6 mg/g creat (ref 0–29)
Microalbumin, Urine: 7.9 ug/mL

## 2021-11-19 NOTE — Progress Notes (Signed)
Microalbuminuria normal lp

## 2022-01-19 ENCOUNTER — Ambulatory Visit: Payer: 59 | Admitting: Cardiology

## 2022-03-24 ENCOUNTER — Ambulatory Visit: Payer: Commercial Managed Care - PPO | Admitting: Legal Medicine

## 2022-03-30 ENCOUNTER — Other Ambulatory Visit: Payer: Self-pay | Admitting: Legal Medicine

## 2022-03-30 DIAGNOSIS — E1169 Type 2 diabetes mellitus with other specified complication: Secondary | ICD-10-CM

## 2022-04-01 ENCOUNTER — Encounter: Payer: Self-pay | Admitting: Family Medicine

## 2022-04-01 ENCOUNTER — Ambulatory Visit: Payer: Commercial Managed Care - PPO | Admitting: Family Medicine

## 2022-04-01 VITALS — BP 130/82 | HR 81 | Temp 97.9°F | Ht 64.0 in | Wt 227.0 lb

## 2022-04-01 DIAGNOSIS — E1169 Type 2 diabetes mellitus with other specified complication: Secondary | ICD-10-CM | POA: Diagnosis not present

## 2022-04-01 DIAGNOSIS — Z1231 Encounter for screening mammogram for malignant neoplasm of breast: Secondary | ICD-10-CM | POA: Diagnosis not present

## 2022-04-01 DIAGNOSIS — Z23 Encounter for immunization: Secondary | ICD-10-CM

## 2022-04-01 DIAGNOSIS — Z6838 Body mass index (BMI) 38.0-38.9, adult: Secondary | ICD-10-CM

## 2022-04-01 DIAGNOSIS — I152 Hypertension secondary to endocrine disorders: Secondary | ICD-10-CM

## 2022-04-01 DIAGNOSIS — E782 Mixed hyperlipidemia: Secondary | ICD-10-CM | POA: Diagnosis not present

## 2022-04-01 DIAGNOSIS — E1159 Type 2 diabetes mellitus with other circulatory complications: Secondary | ICD-10-CM

## 2022-04-01 LAB — HEMOGLOBIN A1C: Hemoglobin A1C: 6.1

## 2022-04-01 LAB — CBC AND DIFFERENTIAL
HCT: 40 (ref 36–46)
Hemoglobin: 13.3 (ref 12.0–16.0)
Platelets: 222 10*3/uL (ref 150–400)
WBC: 4.3

## 2022-04-01 LAB — LIPID PANEL
Cholesterol: 199 (ref 0–200)
HDL: 71 — AB (ref 35–70)
LDL Cholesterol: 96
Triglycerides: 162 — AB (ref 40–160)

## 2022-04-01 LAB — BASIC METABOLIC PANEL
BUN: 18 (ref 4–21)
CO2: 29 — AB (ref 13–22)
Chloride: 102 (ref 99–108)
Creatinine: 0.6 (ref 0.5–1.1)
Glucose: 104
Potassium: 4.2 mEq/L (ref 3.5–5.1)
Sodium: 138 (ref 137–147)

## 2022-04-01 LAB — CBC: RBC: 4.62 (ref 3.87–5.11)

## 2022-04-01 LAB — HEPATIC FUNCTION PANEL
ALT: 25 U/L (ref 7–35)
AST: 32 (ref 13–35)
Alkaline Phosphatase: 268 — AB (ref 25–125)
Bilirubin, Total: 0.5

## 2022-04-01 LAB — COMPREHENSIVE METABOLIC PANEL
Albumin: 4.4 (ref 3.5–5.0)
Calcium: 9.7 (ref 8.7–10.7)
eGFR: >60

## 2022-04-01 MED ORDER — LOSARTAN POTASSIUM 25 MG PO TABS: 25.0000 mg | ORAL_TABLET | Freq: Every day | ORAL | 1 refills | Status: DC

## 2022-04-01 NOTE — Assessment & Plan Note (Signed)
Await labs/testing for assessment and recommendations. Recommend continue to work on eating healthy diet and exercise.  

## 2022-04-01 NOTE — Assessment & Plan Note (Signed)
Recommend continue to work on eating healthy diet and exercise.  

## 2022-04-01 NOTE — Progress Notes (Signed)
Subjective:  Patient ID: Kelsey Dickerson, female    DOB: Feb 09, 1961  Age: 61 y.o. MRN: [id number]  Chief Complaint  Patient presents with   Diabetes   Hypertension    Diabetes Pertinent negatives for hypoglycemia include no dizziness, headaches or nervousness/anxiousness. Pertinent negatives for diabetes include no chest pain, no fatigue and no weakness.  Hypertension Pertinent negatives include no chest pain, headaches, palpitations or shortness of breath.   Diabetes:  Complications: hyperlipidemia Glucose checking: about every other day Glucose logs:83-103 Hypoglycemia: no Most recent A1C:5.9 Current medications: Metformin 500 mg take 1 tablet twice daily. Lisinopril 10 mg daily.  Last Eye Exam: overdue Foot checks: daily.   Hyperlipidemia: Current medications: Simvastatin 40 mg take 1 tablet once at night time.  Hypertension: Current medications: Lisinopril 10 mg take 1 tablet by mouth daily.  Diet: fairly healthy Exercise:  in moderation.  Current Outpatient Medications on File Prior to Visit  Medication Sig Dispense Refill   ACCU-CHEK AVIVA PLUS test strip 1 each by Other route as needed for other. Use as instructed (Patient taking differently: 1 each by Other route daily. Use as instructed) 100 each 6   Accu-Chek Softclix Lancets lancets 1 each by Other route daily. Use as instructed 100 each 6   metFORMIN (GLUCOPHAGE) 500 MG tablet TAKE 1 TABLET BY MOUTH TWO TIMES DAILY. 60 tablet 1   simvastatin (ZOCOR) 40 MG tablet TAKE 1 TABLET BY MOUTH DAILY AT 6 PM. 90 tablet 2   No current facility-administered medications on file prior to visit.   Past Medical History:  Diagnosis Date   Diabetes mellitus without complication ([facility])    Hyperlipidemia    Past Surgical History:  Procedure Laterality Date   COLONOSCOPY  11/03/2019   FOOT SURGERY Right 04/2017   Bunionectomy. 2nd digit hammer toe.   HYSTEROSCOPY      Family History  Problem Relation Age of Onset   Kidney  cancer Mother    Dementia Father    Social History   Socioeconomic History   Marital status: Married    Spouse name: Not on file   Number of children: Not on file   Years of education: Not on file   Highest education level: Not on file  Occupational History   Not on file  Tobacco Use   Smoking status: Never   Smokeless tobacco: Never  Substance and Sexual Activity   Alcohol use: Not Currently   Drug use: Never   Sexual activity: Yes    Partners: Male  Other Topics Concern   Not on file  Social History Narrative   Not on file   Social Determinants of Health   Financial Resource Strain: Not on file  Food Insecurity: Not on file  Transportation Needs: Not on file  Physical Activity: Not on file  Stress: Not on file  Social Connections: Not on file    Review of Systems  Constitutional:  Negative for appetite change, fatigue and fever.  HENT:  Negative for congestion, ear pain, sinus pressure and sore throat.   Respiratory:  Negative for cough, chest tightness, shortness of breath and wheezing.   Cardiovascular:  Negative for chest pain and palpitations.  Gastrointestinal:  Negative for abdominal pain, constipation, diarrhea, nausea and vomiting.  Genitourinary:  Negative for dysuria and hematuria.  Musculoskeletal:  Negative for arthralgias, back pain, joint swelling and myalgias.  Skin:  Negative for rash.  Neurological:  Negative for dizziness, weakness and headaches.  Psychiatric/Behavioral:  Negative for dysphoric mood. The  patient is not nervous/anxious.      Objective:  BP 130/82 (BP Location: Left Arm, Patient Position: Sitting)   Pulse 81   Temp 97.9 F (36.6 C) (Temporal)   Ht 5\' 4"  (1.626 m)   Wt 227 lb (103 kg)   SpO2 97%   BMI 38.96 kg/m      04/01/2022    8:54 AM 11/18/2021    8:59 AM 06/27/2021    8:21 AM  BP/Weight  Systolic BP 130 90 110  Diastolic BP 82 68 70  Wt. (Lbs) 227 218 224  BMI 38.96 kg/m2 37.42 kg/m2 38.45 kg/m2    Physical  Exam Vitals reviewed.  Constitutional:      Appearance: Normal appearance. She is normal weight.  Cardiovascular:     Rate and Rhythm: Normal rate and regular rhythm.     Heart sounds: Normal heart sounds.  Pulmonary:     Effort: Pulmonary effort is normal.     Breath sounds: Normal breath sounds.  Abdominal:     General: Abdomen is flat. Bowel sounds are normal.     Palpations: Abdomen is soft.     Tenderness: There is no abdominal tenderness.  Neurological:     Mental Status: She is alert and oriented to person, place, and time.  Psychiatric:        Mood and Affect: Mood normal.        Behavior: Behavior normal.     Diabetic Foot Exam - Simple   Simple Foot Form Diabetic Foot exam was performed with the following findings: Yes 04/01/2022  9:23 AM  Visual Inspection See comments: Yes Sensation Testing Intact to touch and monofilament testing bilaterally: Yes Pulse Check Posterior Tibialis and Dorsalis pulse intact bilaterally: Yes Comments Scars on right foot from surgery      Lab Results  Component Value Date   WBC 4.3 04/01/2022   HGB 13.3 04/01/2022   HCT 40 04/01/2022   PLT 222 04/01/2022   GLUCOSE 109 (H) 02/03/2021   CHOL 199 04/01/2022   TRIG 162 (A) 04/01/2022   HDL 71 (A) 04/01/2022   LDLCALC 96 04/01/2022   ALT 25 04/01/2022   AST 32 04/01/2022   NA 138 04/01/2022   K 4.2 04/01/2022   CL 102 04/01/2022   CREATININE 0.6 04/01/2022   BUN 18 04/01/2022   CO2 29 (A) 04/01/2022   TSH 0.706 01/19/2020   HGBA1C 6.1 04/01/2022   MICROALBUR 80 06/27/2021      Assessment & Plan:   Problem List Items Addressed This Visit       Cardiovascular and Mediastinum   Hypertension associated with diabetes (H[facility])    Change lisinopril to losartan      Relevant Medications   losartan (COZAAR) 25 MG tablet     Endocrine   Combined hyperlipidemia associated with type 2 diabetes mellitus ([facility])    Await labs/testing for assessment and  recommendations. Recommend continue to work on eating healthy diet and exercise.       Relevant Medications   losartan (COZAAR) 25 MG tablet   Other Relevant Orders   Hemoglobin A1c     Other   Mixed hyperlipidemia - Primary    Well controlled.  No changes to medicines. Continue  Simvastatin 40 mg take 1 tablet once at night time.Continue to work on eating a healthy diet and exercise.  Labs drawn today.        Relevant Medications   losartan (COZAAR) 25 MG tablet   Other Relevant  Orders   CBC with Differential/Platelet   Comprehensive metabolic panel   Lipid panel   Class 2 severe obesity due to excess calories with serious comorbidity and body mass index (BMI) of 38.0 to 38.9 in adult [facility])    Recommend continue to work on eating healthy diet and exercise.       Visit for screening mammogram   Relevant Orders   MM DIGITAL SCREENING BILATERAL   Other Visit Diagnoses     Need for shingles vaccine       Relevant Orders   Zoster Recombinant (Shingrix ) (Completed)     .  Meds ordered this encounter  Medications   losartan (COZAAR) 25 MG tablet    Sig: Take 1 tablet (25 mg total) by mouth daily.    Dispense:  90 tablet    Refill:  1    Orders Placed This Encounter  Procedures   MM DIGITAL SCREENING BILATERAL   Zoster Recombinant (Shingrix )   CBC with Differential/Platelet   Comprehensive metabolic panel   Lipid panel   Hemoglobin A1c     Follow-up: Return in about 9 weeks (around 06/03/2022) for cpe, 4 month fasting chronic visit.  An After Visit Summary was printed and given to the patient. Rochel Brome, MD [facility] [phone]

## 2022-04-05 DIAGNOSIS — I152 Hypertension secondary to endocrine disorders: Secondary | ICD-10-CM | POA: Insufficient documentation

## 2022-04-05 DIAGNOSIS — E1169 Type 2 diabetes mellitus with other specified complication: Secondary | ICD-10-CM | POA: Insufficient documentation

## 2022-04-05 DIAGNOSIS — E119 Type 2 diabetes mellitus without complications: Secondary | ICD-10-CM | POA: Insufficient documentation

## 2022-04-05 NOTE — Assessment & Plan Note (Signed)
Change lisinopril to losartan

## 2022-04-05 NOTE — Assessment & Plan Note (Addendum)
>>  ASSESSMENT AND PLAN FOR MIXED HYPERLIPIDEMIA WRITTEN ON 04/05/2022  9:42 AM BY Dinesh Ulysse, MD  Well controlled.  No changes to medicines. Continue  Simvastatin 40 mg take 1 tablet once at night time.Continue to work on eating a healthy diet and exercise.  Labs drawn today.    >>ASSESSMENT AND PLAN FOR HYPERLIPIDEMIA, MIXED WRITTEN ON 04/01/2022  9:26 AM BY Marvelle Caudill, MD  Await labs/testing for assessment and recommendations. Recommend continue to work on eating healthy diet and exercise.

## 2022-05-20 ENCOUNTER — Ambulatory Visit
Admission: RE | Admit: 2022-05-20 | Discharge: 2022-05-20 | Disposition: A | Discharge status: Home / Self Care | Payer: Commercial Managed Care - PPO | Source: Ambulatory Visit | Attending: Family Medicine | Admitting: Family Medicine

## 2022-05-20 DIAGNOSIS — Z1231 Encounter for screening mammogram for malignant neoplasm of breast: Secondary | ICD-10-CM

## 2022-06-02 ENCOUNTER — Encounter: Payer: Self-pay | Admitting: Family Medicine

## 2022-06-02 ENCOUNTER — Ambulatory Visit (INDEPENDENT_AMBULATORY_CARE_PROVIDER_SITE_OTHER): Payer: Commercial Managed Care - PPO | Admitting: Family Medicine

## 2022-06-02 VITALS — BP 114/68 | HR 80 | Temp 97.3°F | Resp 18 | Ht 64.0 in | Wt 231.0 lb

## 2022-06-02 DIAGNOSIS — Z23 Encounter for immunization: Secondary | ICD-10-CM

## 2022-06-02 DIAGNOSIS — Z124 Encounter for screening for malignant neoplasm of cervix: Secondary | ICD-10-CM | POA: Diagnosis not present

## 2022-06-02 DIAGNOSIS — Z0000 Encounter for general adult medical examination without abnormal findings: Secondary | ICD-10-CM

## 2022-06-02 DIAGNOSIS — Z6839 Body mass index (BMI) 39.0-39.9, adult: Secondary | ICD-10-CM

## 2022-06-02 MED ORDER — PHENTERMINE HCL 37.5 MG PO CAPS: 37.5000 mg | ORAL_CAPSULE | ORAL | 2 refills | Status: DC

## 2022-06-02 NOTE — Progress Notes (Signed)
Subjective:  Patient ID: Kelsey Dickerson, female    DOB: Oct 03, 1960  Age: 61 y.o. MRN: [id number]  Chief Complaint  Patient presents with   Annual Exam    HPI Well Adult Physical: Patient here for a comprehensive physical exam.The patient reports no problems  Do you take any herbs or supplements that were not prescribed by a doctor? no Are you taking calcium supplements? yes Are you taking aspirin daily? no  Physical ("At Risk" items are starred): Patient's last physical exam was 1 year ago .  Safety: reviewed ; Patient wears a seat belt, has smoke detectors, has carbon monoxide detectors, practices appropriate gun safety, and wears sunscreen with extended sun exposure. Dental Care: biannual cleanings, brushes and flosses daily. Ophthalmology/Optometry: Annual visit.  Hearing loss: none Vision impairments: none     06/02/2022   10:08 AM 02/03/2021    7:42 AM 01/19/2020    8:11 AM  Fall Risk   Falls in the past year? 0 0 0  Number falls in past yr: 0 0 0  Injury with Fall? 0 0 0  Risk for fall due to : No Fall Risks    Follow up   Falls evaluation completed        06/02/2022   10:09 AM 06/02/2022   10:03 AM 04/01/2022    8:46 AM 02/03/2021    7:42 AM 10/01/2020    9:59 AM  Depression screen PHQ 2/9  Decreased Interest 0 0 0 0 0  Down, Depressed, Hopeless 0 0 0 0 0  PHQ - 2 Score 0 0 0 0 0  Altered sleeping 0      Tired, decreased energy 0      Change in appetite 0      Feeling bad or failure about yourself  0      Trouble concentrating 0      Moving slowly or fidgety/restless 0      Suicidal thoughts 0      PHQ-9 Score 0      Difficult doing work/chores Not difficult at all           Functional Status Survey:     Health Maintenance  Topic Date Due   DTaP/Tdap/Td vaccine (1 - Tdap) Never done   Pap Smear  Never done   Eye exam for diabetics  06/27/2021   COVID-19 Vaccine (2 - 2023-24 season) 02/20/2022   Hemoglobin A1C  10/01/2022   Yearly kidney health  urinalysis for diabetes  11/19/2022   Yearly kidney function blood test for diabetes  04/02/2023   Complete foot exam   04/02/2023   Mammogram  05/20/2024   Colon Cancer Screening  11/02/2029   Flu Shot  Completed   Hepatitis C Screening: USPSTF Recommendation to screen - Ages 18-79 yo.  Completed   HIV Screening  Completed   Zoster (Shingles) Vaccine  Completed   HPV Vaccine  Aged Out     Social Hx   Social History   Socioeconomic History   Marital status: Married    Spouse name: Not on file   Number of children: Not on file   Years of education: Not on file   Highest education level: Not on file  Occupational History   Not on file  Tobacco Use   Smoking status: Never   Smokeless tobacco: Never  Substance and Sexual Activity   Alcohol use: Not Currently   Drug use: Never   Sexual activity: Yes    Partners: Male  Other  Topics Concern   Not on file  Social History Narrative   Not on file   Social Determinants of Health   Financial Resource Strain: Low Risk  (06/02/2022)   Overall Financial Resource Strain (CARDIA)    Difficulty of Paying Living Expenses: Not hard at all  Food Insecurity: No Food Insecurity (06/02/2022)   Hunger Vital Sign    Worried About Running Out of Food in the Last Year: Never true    Ran Out of Food in the Last Year: Never true  Transportation Needs: No Transportation Needs (06/02/2022)   PRAPARE - Administrator, Civil ServiceTransportation    Lack of Transportation (Medical): No    Lack of Transportation (Non-Medical): No  Physical Activity: Insufficiently Active (06/02/2022)   Exercise Vital Sign    Days of Exercise per Week: 2 days    Minutes of Exercise per Session: 20 min  Stress: Not on file  Social Connections: Socially Integrated (06/02/2022)   Social Connection and Isolation Panel [NHANES]    Frequency of Communication with Friends and Family: More than three times a week    Frequency of Social Gatherings with Friends and Family: Once a week    Attends  Religious Services: More than 4 times per year    Active Member of [employer] Financial[employer]ubs or Organizations: Yes    Attends Engineer, structuralClub or Organization Meetings: More than 4 times per year    Marital Status: Married   Past Medical History:  Diagnosis Date   Diabetes mellitus without complication (HCC)    Hyperlipidemia    Family History  Problem Relation Age of Onset   Kidney cancer Mother    Dementia Father    Breast cancer Neg Hx     Review of Systems  Constitutional:  Negative for chills, fatigue and fever.  HENT:  Negative for congestion, ear pain and sore throat.   Respiratory:  Negative for cough and shortness of breath.   Cardiovascular:  Negative for chest pain.  Gastrointestinal:  Negative for abdominal pain, constipation, diarrhea, nausea and vomiting.  Genitourinary:  Negative for dysuria and urgency.  Musculoskeletal:  Negative for arthralgias and myalgias.  Skin:  Negative for rash.  Neurological:  Negative for dizziness and headaches.  Psychiatric/Behavioral:  Negative for dysphoric mood. The patient is not nervous/anxious.      Objective:  BP 114/68   Pulse 80   Temp (!) 97.3 F (36.3 C)   Resp 18   Ht 5\' 4"  (1.626 m)   Wt 231 lb (104.8 kg)   BMI 39.65 kg/m      06/02/2022    9:58 AM 04/01/2022    8:54 AM 11/18/2021    8:59 AM  BP/Weight  Systolic BP 114 130 90  Diastolic BP 68 82 68  Wt. (Lbs) 231 227 218  BMI 39.65 kg/m2 38.96 kg/m2 37.42 kg/m2    Physical Exam Vitals reviewed. Exam conducted with a chaperone present.  Constitutional:      General: She is not in acute distress.    Appearance: Normal appearance. She is normal weight.  HENT:     Right Ear: Tympanic membrane and ear canal normal.     Left Ear: Tympanic membrane and ear canal normal.     Nose: Nose normal. No congestion or rhinorrhea.  Eyes:     Conjunctiva/sclera: Conjunctivae normal.  Neck:     Thyroid: No thyroid mass.     Vascular: No carotid bruit.  Cardiovascular:     Rate and Rhythm:  Normal rate and regular rhythm.  Pulses: Normal pulses.     Heart sounds: Normal heart sounds. No murmur heard. Pulmonary:     Effort: Pulmonary effort is normal.     Breath sounds: Normal breath sounds.  Abdominal:     General: Bowel sounds are normal.     Palpations: Abdomen is soft. There is no mass.     Tenderness: There is no abdominal tenderness.  Genitourinary:    General: Normal vulva.     Exam position: Lithotomy position.     Vagina: Normal.     Cervix: Normal.  Musculoskeletal:        General: Normal range of motion.  Lymphadenopathy:     Cervical: No cervical adenopathy.  Skin:    General: Skin is warm and dry.  Neurological:     Mental Status: She is alert and oriented to person, place, and time.     Cranial Nerves: No cranial nerve deficit.  Psychiatric:        Mood and Affect: Mood normal.        Behavior: Behavior normal.     Lab Results  Component Value Date   WBC 4.3 04/01/2022   HGB 13.3 04/01/2022   HCT 40 04/01/2022   PLT 222 04/01/2022   GLUCOSE 109 (H) 02/03/2021   CHOL 199 04/01/2022   TRIG 162 (A) 04/01/2022   HDL 71 (A) 04/01/2022   LDLCALC 96 04/01/2022   ALT 25 04/01/2022   AST 32 04/01/2022   NA 138 04/01/2022   K 4.2 04/01/2022   CL 102 04/01/2022   CREATININE 0.6 04/01/2022   BUN 18 04/01/2022   CO2 29 (A) 04/01/2022   TSH 0.706 01/19/2020   HGBA1C 6.1 04/01/2022   MICROALBUR 80 06/27/2021      Assessment & Plan:   Problem List Items Addressed This Visit       Other   Routine medical exam - Primary    Things to do to keep yourself healthy  - Exercise at least 30-45 minutes a day, 3-4 days a week.  - Eat a low-fat diet with lots of fruits and vegetables, up to 7-9 servings per day.  - Seatbelts can save your life. Wear them always.  - Smoke detectors on every level of your home, check batteries every year.  - Eye Doctor - have an eye exam every 1-2 years  - Safe sex - if you may be exposed to STDs, use a condom.  -  Alcohol -  If you drink, do it moderately, less than 2 drinks per day.  - Health Care Power of Attorney. Choose someone to speak for you if you are not able.  - Depression is common in our stressful world.If you're feeling down or losing interest in things you normally enjoy, please come in for a visit.  - Violence - If anyone is threatening or hurting you, please call immediately.      Need for vaccination   Relevant Orders   Zoster Recombinant (Shingrix ) (Completed)   Cervical cancer screening    Pap sent.      Relevant Orders   IGP, Aptima HPV, rfx 16/18,45   Class 2 severe obesity due to excess calories with serious comorbidity and body mass index (BMI) of 39.0 to 39.9 in adult [facility])     Start on phentermine 37.5 mg once daily in am.  Recommend nutritionist.       Relevant Medications   phentermine 37.5 MG capsule     Meds ordered this encounter  Medications   phentermine 37.5 MG capsule    Sig: Take 1 capsule (37.5 mg total) by mouth every morning.    Dispense:  30 capsule    Refill:  2    These are the goals we discussed: Healthy diet and exercise.    This is a list of the screening recommended for you and due dates:  Health Maintenance  Topic Date Due   DTaP/Tdap/Td vaccine (1 - Tdap) Never done   Pap Smear  Never done   Eye exam for diabetics  06/27/2021   COVID-19 Vaccine (2 - 2023-24 season) 02/20/2022   Hemoglobin A1C  10/01/2022   Yearly kidney health urinalysis for diabetes  11/19/2022   Yearly kidney function blood test for diabetes  04/02/2023   Complete foot exam   04/02/2023   Mammogram  05/20/2024   Colon Cancer Screening  11/02/2029   Flu Shot  Completed   Hepatitis C Screening: USPSTF Recommendation to screen - Ages 18-79 yo.  Completed   HIV Screening  Completed   Zoster (Shingles) Vaccine  Completed   HPV Vaccine  Aged Out     Follow-up: Return in about 2 months (around 08/03/2022) for chronic fasting.  Blane Ohara, MD [facility] [phone]

## 2022-06-02 NOTE — Patient Instructions (Addendum)

## 2022-06-07 DIAGNOSIS — Z0000 Encounter for general adult medical examination without abnormal findings: Secondary | ICD-10-CM | POA: Insufficient documentation

## 2022-06-07 DIAGNOSIS — Z23 Encounter for immunization: Secondary | ICD-10-CM | POA: Insufficient documentation

## 2022-06-07 DIAGNOSIS — Z124 Encounter for screening for malignant neoplasm of cervix: Secondary | ICD-10-CM | POA: Insufficient documentation

## 2022-06-07 NOTE — Assessment & Plan Note (Signed)
Pap sent ?

## 2022-06-07 NOTE — Assessment & Plan Note (Signed)
  Start on phentermine 37.5 mg once daily in am.  Recommend nutritionist.

## 2022-06-07 NOTE — Assessment & Plan Note (Signed)

## 2022-06-08 ENCOUNTER — Other Ambulatory Visit: Payer: Self-pay | Admitting: Legal Medicine

## 2022-06-08 DIAGNOSIS — E1159 Type 2 diabetes mellitus with other circulatory complications: Secondary | ICD-10-CM

## 2022-06-08 DIAGNOSIS — E782 Mixed hyperlipidemia: Secondary | ICD-10-CM

## 2022-06-10 LAB — IGP, APTIMA HPV, RFX 16/18,45
HPV Aptima: NEGATIVE
PAP Smear Comment: 0

## 2022-07-23 ENCOUNTER — Other Ambulatory Visit: Payer: Self-pay | Admitting: Family Medicine

## 2022-07-23 DIAGNOSIS — E669 Obesity, unspecified: Secondary | ICD-10-CM

## 2022-08-02 NOTE — Assessment & Plan Note (Addendum)
>>  ASSESSMENT AND PLAN FOR MIXED HYPERLIPIDEMIA WRITTEN ON 08/02/2022  6:49 PM BY LEAL-BORJAS, Ayaz Sondgeroth I, CMA  Well controlled.  No changes to medicines. Taking Simvastatin 40 mg daily. Continue to work on eating a healthy diet and exercise.  Labs drawn today.    >>ASSESSMENT AND PLAN FOR HYPERLIPIDEMIA, MIXED WRITTEN ON 08/03/2022  8:16 AM BY COX, KIRSTEN, MD  Well controlled.  No changes to medicines.  Continue to work on eating a healthy diet and exercise.  Labs drawn today.

## 2022-08-02 NOTE — Progress Notes (Signed)
Subjective:  Patient ID: Kelsey Dickerson, female    DOB: 1961/05/25  Age: 62 y.o. MRN: [id number]  Chief Complaint  Patient presents with   Hyperlipidemia   Diabetes    HPI iabetes:  Complications: hyperlipidemia Glucose checking: about every other day Glucose logs: 77-99 Hypoglycemia: no Most recent A1C:6.1 Current medications: Metformin 500 mg take 1 tablet twice daily.  Last Eye Exam: Not due until March Foot checks: daily.   Hyperlipidemia: Current medications: Simvastatin 40 mg take 1 tablet once at night time.  Hypertension: Current medications: Losartan 25 take 1 tablet by mouth daily.  Diet: fairly healthy Exercise:  daily   Weight Management: currently taking phentermine 37.5, lost 21 lbs total. Starting weight 231 lbs today's weight 210 BMI 36.03.     08/03/2022    7:50 AM 06/02/2022   10:09 AM 06/02/2022   10:03 AM 04/01/2022    8:46 AM 02/03/2021    7:42 AM  Depression screen PHQ 2/9  Decreased Interest 0 0 0 0 0  Down, Depressed, Hopeless 0 0 0 0 0  PHQ - 2 Score 0 0 0 0 0  Altered sleeping  0     Tired, decreased energy  0     Change in appetite  0     Feeling bad or failure about yourself   0     Trouble concentrating  0     Moving slowly or fidgety/restless  0     Suicidal thoughts  0     PHQ-9 Score  0     Difficult doing work/chores  Not difficult at all            01/19/2020    8:11 AM 02/03/2021    7:42 AM 06/02/2022   10:08 AM 08/03/2022    7:51 AM  Fall Risk  Falls in the past year? 0 0 0 0  Was there an injury with Fall? 0 0 0 0  Fall Risk Category Calculator 0 0 0 0  Fall Risk Category (Retired) Low Low Low   (RETIRED) Patient Fall Risk Level Low fall risk Low fall risk Low fall risk   Patient at Risk for Falls Due to   No Fall Risks No Fall Risks  Fall risk Follow up Falls evaluation completed   Falls evaluation completed      Review of Systems  Constitutional:  Negative for fatigue.  HENT:  Negative for congestion, ear pain  and sore throat.   Respiratory:  Negative for cough and shortness of breath.   Cardiovascular:  Negative for chest pain.  Gastrointestinal:  Negative for abdominal pain, constipation, diarrhea, nausea and vomiting.  Genitourinary:  Negative for dysuria, frequency and urgency.  Musculoskeletal:  Negative for arthralgias, back pain and myalgias.  Neurological:  Negative for dizziness and headaches.  Psychiatric/Behavioral:  Negative for agitation and sleep disturbance. The patient is not nervous/anxious.     Current Outpatient Medications on File Prior to Visit  Medication Sig Dispense Refill   ACCU-CHEK AVIVA PLUS test strip 1 each by Other route as needed for other. Use as instructed (Patient taking differently: 1 each by Other route daily. Use as instructed) 100 each 6   Accu-Chek Softclix Lancets lancets 1 each by Other route daily. Use as instructed 100 each 6   losartan (COZAAR) 25 MG tablet Take 1 tablet (25 mg total) by mouth daily. 90 tablet 1   metFORMIN (GLUCOPHAGE) 500 MG tablet TAKE 1 TABLET BY MOUTH TWO TIMES DAILY. 60 tablet  0   simvastatin (ZOCOR) 40 MG tablet TAKE 1 TABLET BY MOUTH DAILY AT 6 PM. 90 tablet 1   No current facility-administered medications on file prior to visit.   Past Medical History:  Diagnosis Date   Diabetes mellitus without complication (HCC)    Hyperlipidemia    Past Surgical History:  Procedure Laterality Date   COLONOSCOPY  11/03/2019   FOOT SURGERY Right 04/2017   Bunionectomy. 2nd digit hammer toe.   HYSTEROSCOPY      Family History  Problem Relation Age of Onset   Kidney cancer Mother    Dementia Father    Breast cancer Neg Hx    Social History   Socioeconomic History   Marital status: Married    Spouse name: Not on file   Number of children: Not on file   Years of education: Not on file   Highest education level: Not on file  Occupational History   Not on file  Tobacco Use   Smoking status: Never   Smokeless tobacco: Never   Substance and Sexual Activity   Alcohol use: Not Currently   Drug use: Never   Sexual activity: Yes    Partners: Male  Other Topics Concern   Not on file  Social History Narrative   Not on file   Social Determinants of Health   Financial Resource Strain: Low Risk  (06/02/2022)   Overall Financial Resource Strain (CARDIA)    Difficulty of Paying Living Expenses: Not hard at all  Food Insecurity: No Food Insecurity (06/02/2022)   Hunger Vital Sign    Worried About Running Out of Food in the Last Year: Never true    Ran Out of Food in the Last Year: Never true  Transportation Needs: No Transportation Needs (06/02/2022)   PRAPARE - Administrator, Civil Service (Medical): No    Lack of Transportation (Non-Medical): No  Physical Activity: Insufficiently Active (06/02/2022)   Exercise Vital Sign    Days of Exercise per Week: 2 days    Minutes of Exercise per Session: 20 min  Stress: No Stress Concern Present (08/03/2022)   [employer] of Occupational Health - Occupational Stress Questionnaire    Feeling of Stress : Not at all  Social Connections: Socially Integrated (06/02/2022)   Social Connection and Isolation Panel [NHANES]    Frequency of Communication with Friends and Family: More than three times a week    Frequency of Social Gatherings with Friends and Family: Once a week    Attends Religious Services: More than 4 times per year    Active Member of [employer] or Organizations: Yes    Attends Engineer, structural: More than 4 times per year    Marital Status: Married    Objective:  BP 118/80   Pulse 84   Temp (!) 97.2 F (36.2 C)   Resp 16   Ht 5\' 4"  (1.626 m)   Wt 210 lb (95.3 kg)   BMI 36.05 kg/m      08/03/2022    7:49 AM 06/02/2022    9:58 AM 04/01/2022    8:54 AM  BP/Weight  Systolic BP 118 114 130  Diastolic BP 80 68 82  Wt. (Lbs) 210 231 227  BMI 36.05 kg/m2 39.65 kg/m2 38.96 kg/m2    Physical Exam Vitals reviewed.   Constitutional:      Appearance: Normal appearance. She is normal weight.  Neck:     Vascular: No carotid bruit.  Cardiovascular:  Rate and Rhythm: Normal rate and regular rhythm.     Heart sounds: Normal heart sounds.  Pulmonary:     Effort: Pulmonary effort is normal. No respiratory distress.     Breath sounds: Normal breath sounds.  Abdominal:     General: Abdomen is flat. Bowel sounds are normal.     Palpations: Abdomen is soft.     Tenderness: There is no abdominal tenderness.  Neurological:     Mental Status: She is alert and oriented to person, place, and time.  Psychiatric:        Mood and Affect: Mood normal.        Behavior: Behavior normal.     Diabetic Foot Exam - Simple   Simple Foot Form Diabetic Foot exam was performed with the following findings: Yes 08/03/2022  8:04 AM  Visual Inspection See comments: Yes Sensation Testing Intact to touch and monofilament testing bilaterally: Yes Pulse Check Posterior Tibialis and Dorsalis pulse intact bilaterally: Yes Comments Callus on right foot      Lab Results  Component Value Date   WBC 4.3 04/01/2022   HGB 13.3 04/01/2022   HCT 40 04/01/2022   PLT 222 04/01/2022   GLUCOSE 109 (H) 02/03/2021   CHOL 199 04/01/2022   TRIG 162 (A) 04/01/2022   HDL 71 (A) 04/01/2022   LDLCALC 96 04/01/2022   ALT 25 04/01/2022   AST 32 04/01/2022   NA 138 04/01/2022   K 4.2 04/01/2022   CL 102 04/01/2022   CREATININE 0.6 04/01/2022   BUN 18 04/01/2022   CO2 29 (A) 04/01/2022   TSH 0.706 01/19/2020   HGBA1C 6.1 04/01/2022   MICROALBUR 80 06/27/2021      Assessment & Plan:    Hypertension associated with diabetes (HCC) Assessment & Plan: Hypertension and Diabetes well controlled.  Recommend check sugars fasting daily. Recommend check feet daily. Recommend annual eye exams. Medicines: Continue Losartan 25 mg daily and metformin 50 mg twice daily  Continue to work on eating a healthy diet and exercise.  Labs  drawn today.    Orders: -     Comprehensive metabolic panel -     Hemoglobin A1c -     CBC with Differential/Platelet -     Microalbumin / creatinine urine ratio -     TSH  Mixed hyperlipidemia Assessment & Plan: Well controlled.  No changes to medicines. Taking Simvastatin 40 mg daily. Continue to work on eating a healthy diet and exercise.  Labs drawn today.    Orders: -     Lipid panel  Class 2 severe obesity due to excess calories with serious comorbidity and body mass index (BMI) of 36.0 to 36.9 in adult [facility]) Assessment & Plan: Recommend continue to work on eating healthy diet and exercise. Continue phentermine 37.5 mg once in am.   Orders: -     Phentermine HCl; Take 1 capsule (37.5 mg total) by mouth every morning.  Dispense: 30 capsule; Refill: 2     Meds ordered this encounter  Medications   phentermine 37.5 MG capsule    Sig: Take 1 capsule (37.5 mg total) by mouth every morning.    Dispense:  30 capsule    Refill:  2    Orders Placed This Encounter  Procedures   Comprehensive metabolic panel   Hemoglobin A1c   Lipid panel   CBC with Differential/Platelet   Microalbumin / creatinine urine ratio   TSH     Follow-up: Return in about 3 months (  around 11/01/2022) for Chronic Follow Up.   I, Katie Bramblett,acting as a scribe for Blane Ohara, MD.,have documented all relevant documentation on the behalf of Blane Ohara, MD,as directed by  Blane Ohara, MD while in the presence of Blane Ohara, MD.  An After Visit Summary was printed and given to the patient.   I attest that I have reviewed this visit and agree with the plan scribed by my staff.   Blane Ohara, MD [facility] [phone]

## 2022-08-02 NOTE — Assessment & Plan Note (Signed)
Well controlled.  ?No changes to medicines.  ?Continue to work on eating a healthy diet and exercise.  ?Labs drawn today.  ?

## 2022-08-02 NOTE — Assessment & Plan Note (Signed)
Well controlled.  No changes to medicines. Losartan 25 mg daily Continue to work on eating a healthy diet and exercise.  Labs drawn today.

## 2022-08-03 ENCOUNTER — Encounter: Payer: Self-pay | Admitting: Family Medicine

## 2022-08-03 ENCOUNTER — Ambulatory Visit: Payer: Commercial Managed Care - PPO | Admitting: Family Medicine

## 2022-08-03 VITALS — BP 118/80 | HR 84 | Temp 97.2°F | Resp 16 | Ht 64.0 in | Wt 210.0 lb

## 2022-08-03 DIAGNOSIS — E782 Mixed hyperlipidemia: Secondary | ICD-10-CM

## 2022-08-03 DIAGNOSIS — E1159 Type 2 diabetes mellitus with other circulatory complications: Secondary | ICD-10-CM | POA: Diagnosis not present

## 2022-08-03 DIAGNOSIS — I152 Hypertension secondary to endocrine disorders: Secondary | ICD-10-CM

## 2022-08-03 DIAGNOSIS — Z6836 Body mass index (BMI) 36.0-36.9, adult: Secondary | ICD-10-CM

## 2022-08-03 DIAGNOSIS — E1169 Type 2 diabetes mellitus with other specified complication: Secondary | ICD-10-CM

## 2022-08-03 LAB — HEPATIC FUNCTION PANEL
ALT: 21 U/L (ref 7–35)
AST: 28 (ref 13–35)
Alkaline Phosphatase: 91 (ref 25–125)
Bilirubin, Total: 0.3

## 2022-08-03 LAB — BASIC METABOLIC PANEL
BUN: 16 (ref 4–21)
CO2: 28 — AB (ref 13–22)
Chloride: 103 (ref 99–108)
Creatinine: 0.6 (ref 0.5–1.1)
Glucose: 97
Potassium: 4.4 mEq/L (ref 3.5–5.1)
Sodium: 138 (ref 137–147)

## 2022-08-03 LAB — CBC AND DIFFERENTIAL
HCT: 39 (ref 36–46)
Hemoglobin: 13 (ref 12.0–16.0)
Platelets: 195 10*3/uL (ref 150–400)
WBC: 3.7

## 2022-08-03 LAB — COMPREHENSIVE METABOLIC PANEL
Albumin: 4 (ref 3.5–5.0)
Calcium: 9.7 (ref 8.7–10.7)
eGFR: >60

## 2022-08-03 LAB — HEMOGLOBIN A1C: Hemoglobin A1C: 5.9

## 2022-08-03 LAB — LIPID PANEL
Cholesterol: 137 (ref 0–200)
HDL: 65 (ref 35–70)
LDL Cholesterol: 57
LDl/HDL Ratio: 2.1
Triglycerides: 73 (ref 40–160)

## 2022-08-03 LAB — MICROALBUMIN / CREATININE URINE RATIO: Microalb Creat Ratio: 12

## 2022-08-03 LAB — CBC: RBC: 4.49 (ref 3.87–5.11)

## 2022-08-03 MED ORDER — PHENTERMINE HCL 37.5 MG PO CAPS: 37.5000 mg | ORAL_CAPSULE | ORAL | 2 refills | Status: DC

## 2022-08-03 NOTE — Assessment & Plan Note (Signed)
Recommend continue to work on eating healthy diet and exercise. Continue phentermine 37.5 mg once in am.

## 2022-08-03 NOTE — Patient Instructions (Addendum)
Need Prevnar 20 vaccine Ask employee to send over vaccine records.

## 2022-08-24 ENCOUNTER — Other Ambulatory Visit: Payer: Self-pay | Admitting: Family Medicine

## 2022-08-24 DIAGNOSIS — I152 Hypertension secondary to endocrine disorders: Secondary | ICD-10-CM

## 2022-08-25 ENCOUNTER — Ambulatory Visit: Payer: Commercial Managed Care - PPO | Admitting: Family Medicine

## 2022-08-25 VITALS — BP 120/78 | HR 98 | Temp 97.5°F | Ht 64.0 in | Wt 203.0 lb

## 2022-08-25 DIAGNOSIS — L509 Urticaria, unspecified: Secondary | ICD-10-CM

## 2022-08-25 DIAGNOSIS — T7840XA Allergy, unspecified, initial encounter: Secondary | ICD-10-CM | POA: Diagnosis not present

## 2022-08-25 MED ORDER — TRIAMCINOLONE ACETONIDE 40 MG/ML IJ SUSP: 80.0000 mg | Freq: Once | INTRAMUSCULAR | Status: DC

## 2022-08-25 MED ORDER — TRIAMCINOLONE ACETONIDE 40 MG/ML IJ SUSP
80.0000 mg | Freq: Once | INTRAMUSCULAR | Status: AC
  Administered 2022-08-25: 80 mg via INTRAMUSCULAR

## 2022-08-25 NOTE — Progress Notes (Signed)
Acute Office Visit  Subjective:    Patient ID: Kelsey Dickerson, female    DOB: 05/25/1961, 62 y.o.   MRN: [id number]  Chief Complaint  Patient presents with   Rash    HPI: Patient is in today for rash that is across her chest, under her breast and under her arms. States it started after starting on Phentermine. Rash was itchy but itching has resolved, Has since stopped Phentermine and taking otc anti itch medications.  Past Medical History:  Diagnosis Date   Diabetes mellitus without complication (HCC)    Hyperlipidemia     Past Surgical History:  Procedure Laterality Date   COLONOSCOPY  11/03/2019   FOOT SURGERY Right 04/2017   Bunionectomy. 2nd digit hammer toe.   HYSTEROSCOPY      Family History  Problem Relation Age of Onset   Kidney cancer Mother    Dementia Father    Breast cancer Neg Hx     Social History   Socioeconomic History   Marital status: Married    Spouse name: Not on file   Number of children: Not on file   Years of education: Not on file   Highest education level: Not on file  Occupational History   Not on file  Tobacco Use   Smoking status: Never   Smokeless tobacco: Never  Substance and Sexual Activity   Alcohol use: Not Currently   Drug use: Never   Sexual activity: Yes    Partners: Male  Other Topics Concern   Not on file  Social History Narrative   Not on file   Social Determinants of Health   Financial Resource Strain: Low Risk  (06/02/2022)   Overall Financial Resource Strain (CARDIA)    Difficulty of Paying Living Expenses: Not hard at all  Food Insecurity: No Food Insecurity (06/02/2022)   Hunger Vital Sign    Worried About Running Out of Food in the Last Year: Never true    Ran Out of Food in the Last Year: Never true  Transportation Needs: No Transportation Needs (06/02/2022)   PRAPARE - Administrator, Civil Service (Medical): No    Lack of Transportation (Non-Medical): No  Physical Activity: Insufficiently  Active (06/02/2022)   Exercise Vital Sign    Days of Exercise per Week: 2 days    Minutes of Exercise per Session: 20 min  Stress: No Stress Concern Present (08/03/2022)   [employer] of Occupational Health - Occupational Stress Questionnaire    Feeling of Stress : Not at all  Social Connections: Socially Integrated (06/02/2022)   Social Connection and Isolation Panel [NHANES]    Frequency of Communication with Friends and Family: More than three times a week    Frequency of Social Gatherings with Friends and Family: Once a week    Attends Religious Services: More than 4 times per year    Active Member of [employer] or Organizations: Yes    Attends Engineer, structural: More than 4 times per year    Marital Status: Married  Catering manager Violence: Not At Risk (06/02/2022)   Humiliation, Afraid, Rape, and Kick questionnaire    Fear of Current or Ex-Partner: No    Emotionally Abused: No    Physically Abused: No    Sexually Abused: No    Outpatient Medications Prior to Visit  Medication Sig Dispense Refill   ACCU-CHEK AVIVA PLUS test strip 1 each by Other route as needed for other. Use as instructed (Patient taking differently: 1  each by Other route daily. Use as instructed) 100 each 6   Accu-Chek Softclix Lancets lancets 1 each by Other route daily. Use as instructed 100 each 6   losartan (COZAAR) 25 MG tablet Take 1 tablet (25 mg total) by mouth daily. 90 tablet 1   metFORMIN (GLUCOPHAGE) 500 MG tablet TAKE 1 TABLET BY MOUTH TWO TIMES DAILY. 60 tablet 0   phentermine 37.5 MG capsule Take 1 capsule (37.5 mg total) by mouth every morning. 30 capsule 2   simvastatin (ZOCOR) 40 MG tablet TAKE 1 TABLET BY MOUTH DAILY AT 6 PM. 90 tablet 1   No facility-administered medications prior to visit.    Allergies  Allergen Reactions   Lisinopril     cough   Oxycodone    Penicillins     Review of Systems  HENT:  Negative for congestion and sore throat.   Respiratory:   Negative for shortness of breath.   Skin:  Positive for rash.       Objective:        08/25/2022    1:42 PM 08/03/2022    7:49 AM 06/02/2022    9:58 AM  Vitals with BMI  Height 5\' 4"  5\' 4"  5\' 4"   Weight 203 lbs 210 lbs 231 lbs  BMI 34.83 36.03 39.63  Systolic 120 118 956  Diastolic 78 80 68  Pulse 98 84 80    No data found.   Physical Exam Vitals reviewed.  Constitutional:      Appearance: Normal appearance.  Cardiovascular:     Rate and Rhythm: Normal rate and regular rhythm.     Heart sounds: Normal heart sounds.  Pulmonary:     Effort: Pulmonary effort is normal.     Breath sounds: Normal breath sounds.  Skin:    Findings: Rash (urticaria over chest, neck, back.) present. Rash is purpuric.  Neurological:     Mental Status: She is alert.    Health Maintenance Due  Topic Date Due   DTaP/Tdap/Td (1 - Tdap) Never done   OPHTHALMOLOGY EXAM  06/27/2021   COVID-19 Vaccine (2 - 2023-24 season) 02/20/2022    There are no preventive care reminders to display for this patient.   Lab Results  Component Value Date   TSH 0.706 01/19/2020   Lab Results  Component Value Date   WBC 3.7 08/03/2022   HGB 13.0 08/03/2022   HCT 39 08/03/2022   MCV 89 02/03/2021   PLT 195 08/03/2022   Lab Results  Component Value Date   NA 138 08/03/2022   K 4.4 08/03/2022   CO2 28 (A) 08/03/2022   GLUCOSE 109 (H) 02/03/2021   BUN 16 08/03/2022   CREATININE 0.6 08/03/2022   BILITOT 0.3 02/03/2021   ALKPHOS 91 08/03/2022   AST 28 08/03/2022   ALT 21 08/03/2022   PROT 7.0 02/03/2021   ALBUMIN 4.0 08/03/2022   CALCIUM 9.7 08/03/2022   EGFR >60 08/03/2022   Lab Results  Component Value Date   CHOL 137 08/03/2022   Lab Results  Component Value Date   HDL 65 08/03/2022   Lab Results  Component Value Date   LDLCALC 57 08/03/2022   Lab Results  Component Value Date   TRIG 73 08/03/2022   Lab Results  Component Value Date   CHOLHDL 2.7 02/03/2021   Lab Results   Component Value Date   HGBA1C 5.9 08/03/2022       Assessment & Plan:  Allergic reaction to drug, initial encounter Assessment &  Plan: Stopped phentermine. Ordered Kenalog 80 mg IM #1.  Orders: -     Triamcinolone Acetonide  Urticaria Assessment & Plan: Kenalog shot given.  Recommend antihistamine.      Meds ordered this encounter  Medications   DISCONTD: triamcinolone acetonide (KENALOG-40) injection 80 mg   triamcinolone acetonide (KENALOG-40) injection 80 mg    No orders of the defined types were placed in this encounter.    Follow-up: Return if symptoms worsen or fail to improve.  An After Visit Summary was printed and given to the patient.   Clayborn Bigness I Leal-Borjas,acting as a scribe for Blane Ohara, MD.,have documented all relevant documentation on the behalf of Blane Ohara, MD,as directed by  Blane Ohara, MD while in the presence of Blane Ohara, MD.   I attest that I have reviewed this visit and agree with the plan scribed by my staff.   Blane Ohara, MD [facility] [phone]

## 2022-08-29 DIAGNOSIS — T7840XA Allergy, unspecified, initial encounter: Secondary | ICD-10-CM | POA: Insufficient documentation

## 2022-08-29 HISTORY — DX: Allergy, unspecified, initial encounter: T78.40XA

## 2022-08-29 NOTE — Assessment & Plan Note (Signed)
Stopped phentermine. Ordered Kenalog 80 mg IM #1.

## 2022-08-30 ENCOUNTER — Encounter: Payer: Self-pay | Admitting: Family Medicine

## 2022-08-30 DIAGNOSIS — L509 Urticaria, unspecified: Secondary | ICD-10-CM | POA: Insufficient documentation

## 2022-08-30 NOTE — Assessment & Plan Note (Signed)
Kenalog shot given.  Recommend antihistamine.

## 2022-09-14 ENCOUNTER — Other Ambulatory Visit: Payer: Self-pay | Admitting: Family Medicine

## 2022-09-14 DIAGNOSIS — E669 Obesity, unspecified: Secondary | ICD-10-CM

## 2022-10-15 ENCOUNTER — Other Ambulatory Visit: Payer: Self-pay | Admitting: Family Medicine

## 2022-10-15 DIAGNOSIS — E1159 Type 2 diabetes mellitus with other circulatory complications: Secondary | ICD-10-CM

## 2022-10-26 ENCOUNTER — Other Ambulatory Visit: Payer: Self-pay | Admitting: Family Medicine

## 2022-10-26 DIAGNOSIS — I152 Hypertension secondary to endocrine disorders: Secondary | ICD-10-CM

## 2022-10-26 LAB — HM DIABETES EYE EXAM

## 2022-11-03 ENCOUNTER — Encounter: Payer: Self-pay | Admitting: Family Medicine

## 2022-11-03 NOTE — Assessment & Plan Note (Signed)
Hypertension and Diabetes well controlled.  Recommend check sugars fasting daily. Recommend check feet daily. Recommend annual eye exams. Medicines: Continue Losartan 25 mg daily and metformin 50 mg twice daily  Continue to work on eating a healthy diet and exercise.  Labs drawn today.

## 2022-11-03 NOTE — Progress Notes (Unsigned)
Subjective:  Patient ID: Kelsey Dickerson, female    DOB: [birth year]  Age: 62 y.o. MRN: [id number]  No chief complaint on file.   HPI   Diabetes:  Complications: hyperlipidemia Glucose checking: about every other day Glucose logs: 77-99 Hypoglycemia: no Most recent A1C:5.9 Current medications: Metformin 500 mg take 1 tablet twice daily.  Last Eye Exam: Not due until March Foot checks: daily.   Hyperlipidemia: Current medications: Simvastatin 40 mg take 1 tablet once at night time.  Hypertension: Current medications: Losartan 25 take 1 tablet by mouth daily.  Diet: fairly healthy Exercise:  daily   Weight Management: currently taking phentermine 37.5, lost 21 lbs total. Starting weight 231 lbs today's weight 210 BMI 36.03.     08/03/2022    7:50 AM 06/02/2022   10:09 AM 06/02/2022   10:03 AM 04/01/2022    8:46 AM 02/03/2021    7:42 AM  Depression screen PHQ 2/9  Decreased Interest 0 0 0 0 0  Down, Depressed, Hopeless 0 0 0 0 0  PHQ - 2 Score 0 0 0 0 0  Altered sleeping  0     Tired, decreased energy  0     Change in appetite  0     Feeling bad or failure about yourself   0     Trouble concentrating  0     Moving slowly or fidgety/restless  0     Suicidal thoughts  0     PHQ-9 Score  0     Difficult doing work/chores  Not difficult at all           08/03/2022    7:51 AM  Fall Risk   Falls in the past year? 0  Number falls in past yr: 0  Injury with Fall? 0  Risk for fall due to : No Fall Risks  Follow up Falls evaluation completed    Patient Care Team: Blane Ohara, MD as PCP - General (Internal Medicine)   Review of Systems  Current Outpatient Medications on File Prior to Visit  Medication Sig Dispense Refill   ACCU-CHEK AVIVA PLUS test strip 1 each by Other route as needed for other. Use as instructed (Patient taking differently: 1 each by Other route daily. Use as instructed) 100 each 6   Accu-Chek Softclix Lancets lancets 1 each by Other route daily.  Use as instructed 100 each 6   losartan (COZAAR) 25 MG tablet TAKE 1 TABLET BY MOUTH DAILY. 90 tablet 0   metFORMIN (GLUCOPHAGE) 500 MG tablet TAKE 1 TABLET BY MOUTH TWO TIMES DAILY. 60 tablet 0   phentermine 37.5 MG capsule Take 1 capsule (37.5 mg total) by mouth every morning. 30 capsule 2   simvastatin (ZOCOR) 40 MG tablet TAKE 1 TABLET BY MOUTH DAILY AT 6 PM. 90 tablet 1   No current facility-administered medications on file prior to visit.   Past Medical History:  Diagnosis Date   Allergic drug reaction 08/29/2022   Diabetes mellitus without complication (HCC)    Hyperlipidemia    Past Surgical History:  Procedure Laterality Date   COLONOSCOPY  11/03/2019   FOOT SURGERY Right 04/2017   Bunionectomy. 2nd digit hammer toe.   HYSTEROSCOPY      Family History  Problem Relation Age of Onset   Kidney cancer Mother    Dementia Father    Breast cancer Neg Hx    Social History   Socioeconomic History   Marital status: Married    Spouse name:  Not on file   Number of children: Not on file   Years of education: Not on file   Highest education level: Not on file  Occupational History   Not on file  Tobacco Use   Smoking status: Never   Smokeless tobacco: Never  Substance and Sexual Activity   Alcohol use: Not Currently   Drug use: Never   Sexual activity: Yes    Partners: Male  Other Topics Concern   Not on file  Social History Narrative   Not on file   Social Determinants of Health   Financial Resource Strain: Low Risk  (06/02/2022)   Overall Financial Resource Strain (CARDIA)    Difficulty of Paying Living Expenses: Not hard at all  Food Insecurity: No Food Insecurity (06/02/2022)   Hunger Vital Sign    Worried About Running Out of Food in the Last Year: Never true    Ran Out of Food in the Last Year: Never true  Transportation Needs: No Transportation Needs (06/02/2022)   PRAPARE - Administrator, Civil Service (Medical): No    Lack of  Transportation (Non-Medical): No  Physical Activity: Insufficiently Active (06/02/2022)   Exercise Vital Sign    Days of Exercise per Week: 2 days    Minutes of Exercise per Session: 20 min  Stress: No Stress Concern Present (08/03/2022)   [employer] of Occupational Health - Occupational Stress Questionnaire    Feeling of Stress : Not at all  Social Connections: Socially Integrated (06/02/2022)   Social Connection and Isolation Panel [NHANES]    Frequency of Communication with Friends and Family: More than three times a week    Frequency of Social Gatherings with Friends and Family: Once a week    Attends Religious Services: More than 4 times per year    Active Member of [employer] or Organizations: Yes    Attends Engineer, structural: More than 4 times per year    Marital Status: Married    Objective:  There were no vitals taken for this visit.     08/25/2022    1:42 PM 08/03/2022    7:49 AM 06/02/2022    9:58 AM  BP/Weight  Systolic BP 120 118 114  Diastolic BP 78 80 68  Wt. (Lbs) 203 210 231  BMI 34.84 kg/m2 36.05 kg/m2 39.65 kg/m2    Physical Exam  Diabetic Foot Exam - Simple   No data filed      Lab Results  Component Value Date   WBC 3.7 08/03/2022   HGB 13.0 08/03/2022   HCT 39 08/03/2022   PLT 195 08/03/2022   GLUCOSE 109 (H) 02/03/2021   CHOL 137 08/03/2022   TRIG 73 08/03/2022   HDL 65 08/03/2022   LDLCALC 57 08/03/2022   ALT 21 08/03/2022   AST 28 08/03/2022   NA 138 08/03/2022   K 4.4 08/03/2022   CL 103 08/03/2022   CREATININE 0.6 08/03/2022   BUN 16 08/03/2022   CO2 28 (A) 08/03/2022   TSH 0.706 01/19/2020   HGBA1C 5.9 08/03/2022   MICROALBUR 80 06/27/2021      Assessment & Plan:    Hypertension associated with diabetes (HCC) Assessment & Plan: Hypertension and Diabetes well controlled.  Recommend check sugars fasting daily. Recommend check feet daily. Recommend annual eye exams. Medicines: Continue Losartan 25 mg daily  and metformin 50 mg twice daily  Continue to work on eating a healthy diet and exercise.  Labs drawn today.  Mixed hyperlipidemia Assessment & Plan: Well controlled.  No changes to medicines. Simvastatin 40 mg daily Continue to work on eating a healthy diet and exercise.  Labs drawn today.     Type 2 diabetes mellitus with other specified complication, without long-term current use of insulin (HCC)     No orders of the defined types were placed in this encounter.   No orders of the defined types were placed in this encounter.    Follow-up: No follow-ups on file.   I,Marla I Leal-Borjas,acting as a scribe for Blane Ohara, MD.,have documented all relevant documentation on the behalf of Blane Ohara, MD,as directed by  Blane Ohara, MD while in the presence of Blane Ohara, MD.   An After Visit Summary was printed and given to the patient.  Blane Ohara, MD [facility] [phone]

## 2022-11-03 NOTE — Assessment & Plan Note (Signed)
Well controlled.  No changes to medicines. Simvastatin 40 mg daily. Continue to work on eating a healthy diet and exercise.  Labs drawn today.   

## 2022-11-04 ENCOUNTER — Ambulatory Visit: Payer: Commercial Managed Care - PPO | Admitting: Family Medicine

## 2022-11-04 ENCOUNTER — Encounter: Payer: Self-pay | Admitting: Family Medicine

## 2022-11-04 VITALS — BP 124/70 | HR 78 | Temp 78.0°F | Resp 16 | Ht 64.0 in | Wt 183.0 lb

## 2022-11-04 DIAGNOSIS — Z6831 Body mass index (BMI) 31.0-31.9, adult: Secondary | ICD-10-CM

## 2022-11-04 DIAGNOSIS — E1169 Type 2 diabetes mellitus with other specified complication: Secondary | ICD-10-CM | POA: Diagnosis not present

## 2022-11-04 DIAGNOSIS — E782 Mixed hyperlipidemia: Secondary | ICD-10-CM | POA: Diagnosis not present

## 2022-11-04 DIAGNOSIS — Z6829 Body mass index (BMI) 29.0-29.9, adult: Secondary | ICD-10-CM | POA: Insufficient documentation

## 2022-11-04 DIAGNOSIS — E6609 Other obesity due to excess calories: Secondary | ICD-10-CM | POA: Diagnosis not present

## 2022-11-04 DIAGNOSIS — E1159 Type 2 diabetes mellitus with other circulatory complications: Secondary | ICD-10-CM

## 2022-11-04 LAB — LIPID PANEL
Cholesterol: 163 (ref 0–200)
HDL: 68 (ref 35–70)
LDL Cholesterol: 83
Triglycerides: 58 (ref 40–160)

## 2022-11-04 LAB — CBC AND DIFFERENTIAL
HCT: 39 (ref 36–46)
Hemoglobin: 13.4 (ref 12.0–16.0)
Neutrophils Absolute: 61
Platelets: 205 10*3/uL (ref 150–400)
WBC: 5.1

## 2022-11-04 LAB — COMPREHENSIVE METABOLIC PANEL
Albumin: 4 (ref 3.5–5.0)
eGFR: 0.6

## 2022-11-04 LAB — CBC: RBC: 4.45 (ref 3.87–5.11)

## 2022-11-04 LAB — BASIC METABOLIC PANEL
BUN: 20 (ref 4–21)
CO2: 29 — AB (ref 13–22)
Chloride: 103 (ref 99–108)
Creatinine: 0.5 (ref 0.5–1.1)
Glucose: 102
Potassium: 3.9 mEq/L (ref 3.5–5.1)
Sodium: 139 (ref 137–147)

## 2022-11-04 LAB — HEPATIC FUNCTION PANEL
ALT: 24 U/L (ref 7–35)
AST: 29 (ref 13–35)
Alkaline Phosphatase: 108 (ref 25–125)

## 2022-11-04 LAB — HEMOGLOBIN A1C: Hemoglobin A1C: 5.5

## 2022-11-04 NOTE — Assessment & Plan Note (Signed)
Encouraged patient to continue exercise and healthy diet. Doing great.

## 2022-11-09 ENCOUNTER — Encounter: Payer: Self-pay | Admitting: Family Medicine

## 2022-11-09 ENCOUNTER — Other Ambulatory Visit: Payer: Self-pay

## 2022-11-24 ENCOUNTER — Telehealth: Payer: Self-pay

## 2022-11-24 MED ORDER — METFORMIN HCL 500 MG PO TABS: 500.0000 mg | ORAL_TABLET | Freq: Every day | ORAL | 1 refills | Status: DC

## 2022-11-24 MED ORDER — SIMVASTATIN 20 MG PO TABS: 20.0000 mg | ORAL_TABLET | Freq: Every day | ORAL | 1 refills | Status: DC

## 2022-11-24 NOTE — Telephone Encounter (Signed)
Pharmacy told the patient that they never received the new prescriptions with new directions for the metformin or the simvastatin. Re-sending to the pharmacy.

## 2022-12-09 ENCOUNTER — Other Ambulatory Visit: Payer: Self-pay | Admitting: Family Medicine

## 2023-03-06 IMAGING — MG MM DIGITAL SCREENING BILAT W/ TOMO AND CAD
8 series · 8 of 24 positions shown · non-contrast
Comparison: Previous exam(s).

CLINICAL DATA: Screening.

EXAM:
DIGITAL SCREENING BILATERAL MAMMOGRAM WITH TOMOSYNTHESIS AND CAD
TECHNIQUE: Bilateral screening digital craniocaudal and mediolateral oblique
mammograms were obtained. Bilateral screening digital breast
tomosynthesis was performed. The images were evaluated with
computer-aided detection.

[L MLO synth-2D]
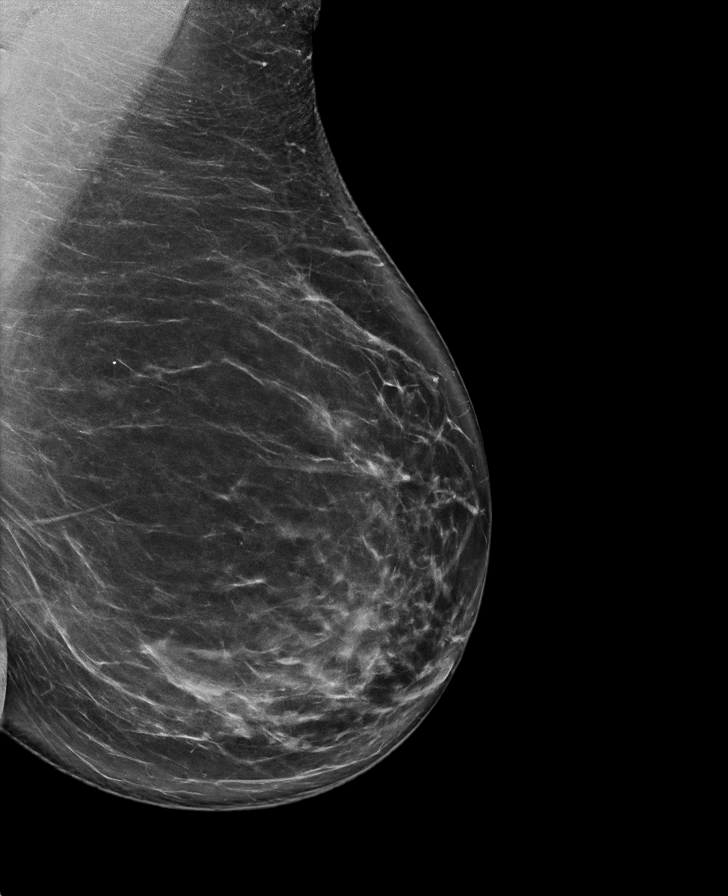

[R MLO synth-2D]
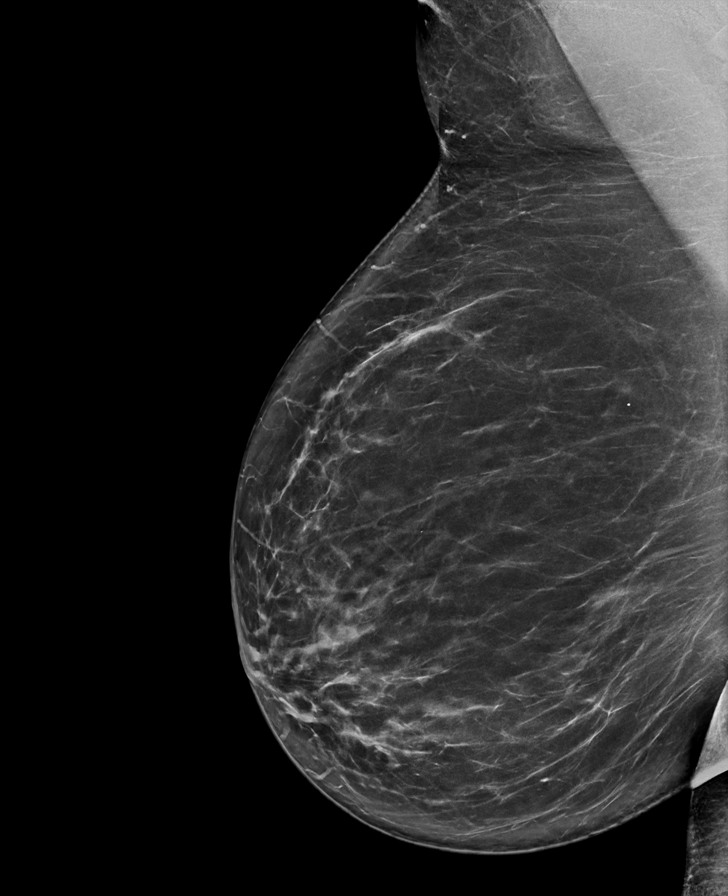

[L CC synth-2D]
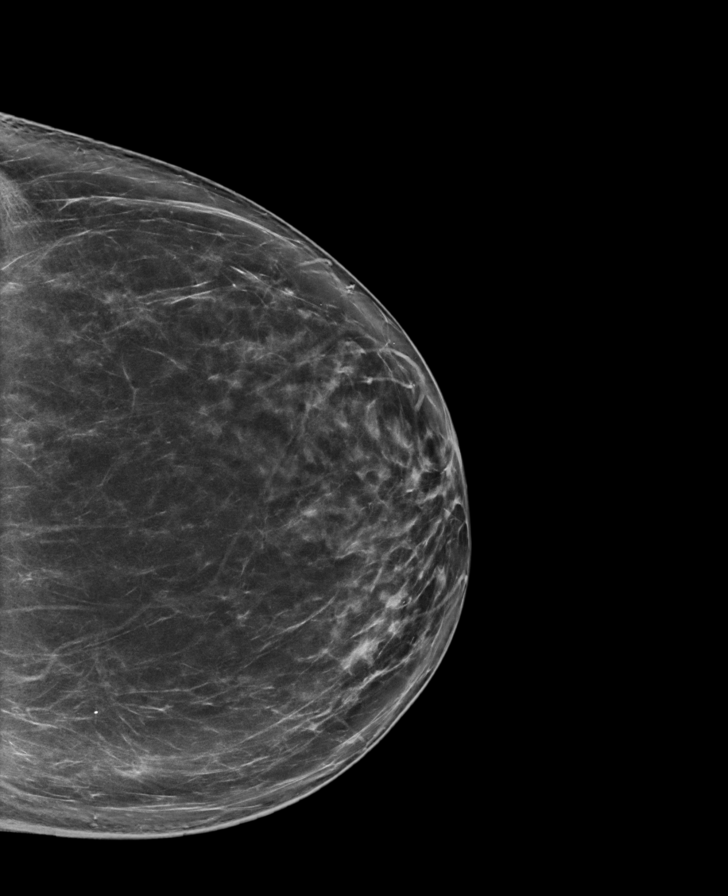

[R CC synth-2D]
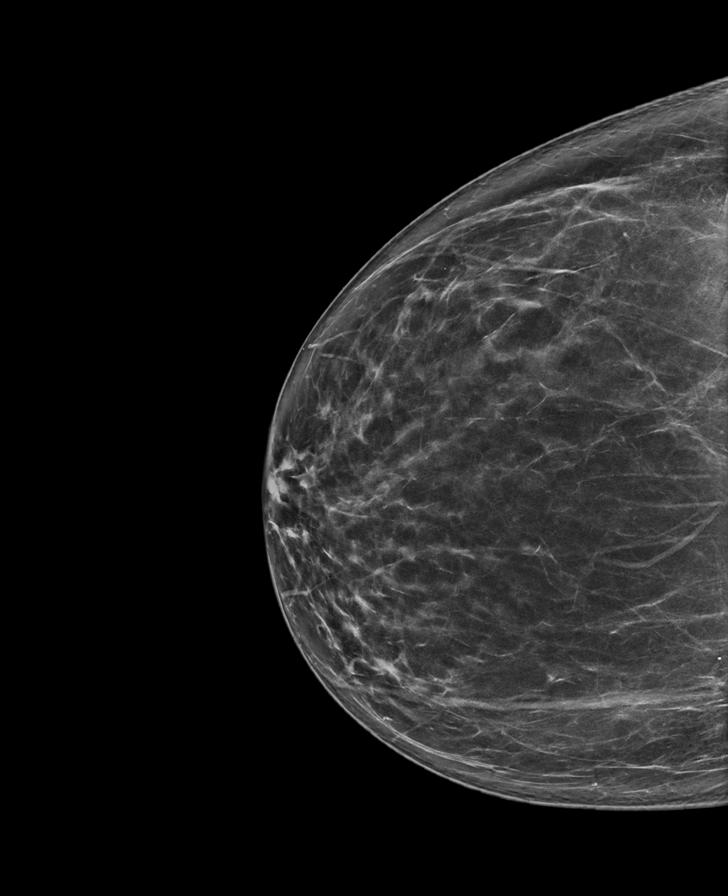

[R CC tomo · tomo slice 41/82.0]
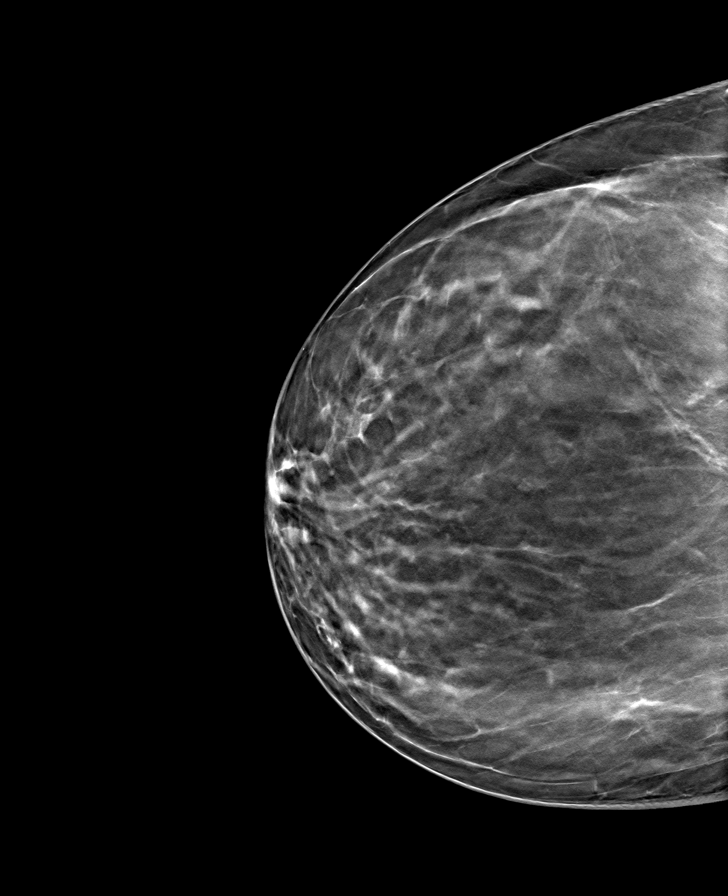

[L CC tomo · tomo slice 45/88.0]
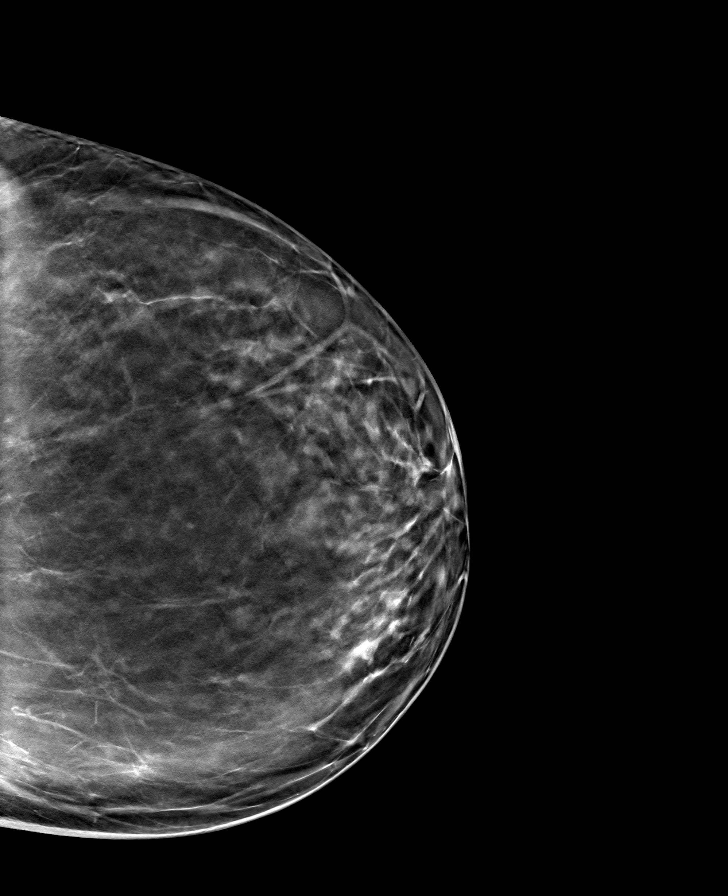

[R MLO tomo · tomo slice 47/92.0]
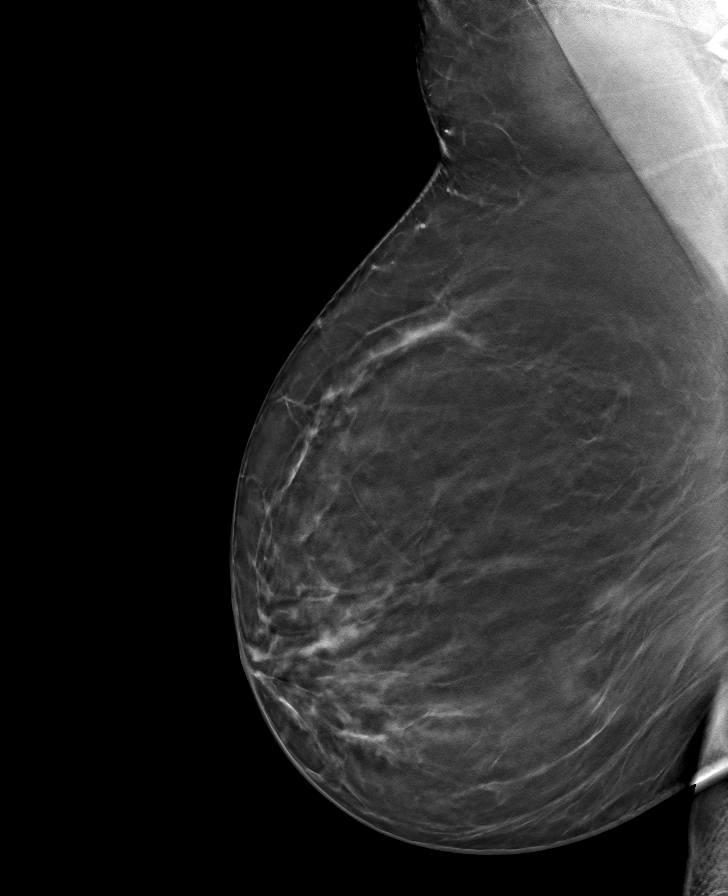

[L MLO tomo · tomo slice 49/97.0]
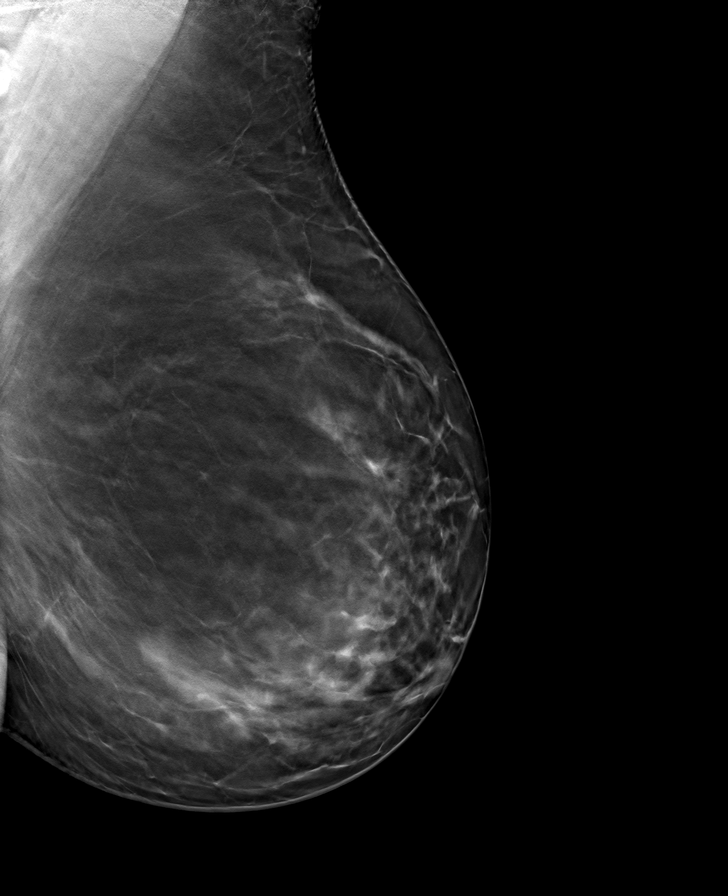

[8 of 24 positions shown; findings below may reference images not displayed]

ACR Breast Density Category b: There are scattered areas of
fibroglandular density.
FINDINGS: There are no findings suspicious for malignancy.
IMPRESSION: No mammographic evidence of malignancy. A result letter of this
screening mammogram will be mailed directly to the patient.

RECOMMENDATION:
Screening mammogram in one year. (Code:51-O-LD2)

BI-RADS CATEGORY  1: Negative.

## 2023-03-09 LAB — BASIC METABOLIC PANEL
BUN: 26 — AB (ref 4–21)
CO2: 32 — AB (ref 13–22)
Chloride: 104 (ref 99–108)
Creatinine: 0.5 (ref 0.5–1.1)
Glucose: 107
Potassium: 4 mEq/L (ref 3.5–5.1)
Sodium: 139 (ref 137–147)

## 2023-03-09 LAB — CBC: RBC: 4.48 (ref 3.87–5.11)

## 2023-03-09 LAB — HEMOGLOBIN A1C: Hemoglobin A1C: 5.4

## 2023-03-09 LAB — HEPATIC FUNCTION PANEL
ALT: 33 U/L (ref 7–35)
AST: 38 — AB (ref 13–35)
Alkaline Phosphatase: 105 (ref 25–125)
Bilirubin, Total: 0.6

## 2023-03-09 LAB — CBC AND DIFFERENTIAL
HCT: 41 (ref 36–46)
Hemoglobin: 13.8 (ref 12.0–16.0)
Platelets: 190 10*3/uL (ref 150–400)
WBC: 43

## 2023-03-09 LAB — LIPID PANEL
Cholesterol: 188 (ref 0–200)
HDL: 70 (ref 35–70)
LDL Cholesterol: 103
LDl/HDL Ratio: 2.7
Triglycerides: 74 (ref 40–160)

## 2023-03-09 LAB — COMPREHENSIVE METABOLIC PANEL: Calcium: 9.5 (ref 8.7–10.7)

## 2023-03-10 ENCOUNTER — Ambulatory Visit: Payer: Commercial Managed Care - PPO | Admitting: Family Medicine

## 2023-03-10 ENCOUNTER — Encounter: Payer: Self-pay | Admitting: Family Medicine

## 2023-03-10 VITALS — BP 102/64 | HR 84 | Temp 96.4°F | Resp 14 | Ht 64.0 in | Wt 170.0 lb

## 2023-03-10 DIAGNOSIS — I152 Hypertension secondary to endocrine disorders: Secondary | ICD-10-CM | POA: Insufficient documentation

## 2023-03-10 DIAGNOSIS — E782 Mixed hyperlipidemia: Secondary | ICD-10-CM | POA: Insufficient documentation

## 2023-03-10 DIAGNOSIS — M79671 Pain in right foot: Secondary | ICD-10-CM | POA: Diagnosis not present

## 2023-03-10 DIAGNOSIS — E1159 Type 2 diabetes mellitus with other circulatory complications: Secondary | ICD-10-CM | POA: Diagnosis not present

## 2023-03-10 DIAGNOSIS — E1169 Type 2 diabetes mellitus with other specified complication: Secondary | ICD-10-CM | POA: Insufficient documentation

## 2023-03-10 NOTE — Assessment & Plan Note (Signed)
Well controlled.  No changes to medicines. Losartan 25 mg daily Continue to work on eating a healthy diet and exercise.  Labs drawn today.

## 2023-03-10 NOTE — Progress Notes (Signed)
Subjective:  Patient ID: Kelsey Dickerson, female    DOB: 20-Jan-1961  Age: 62 y.o. MRN: [id number]  Chief Complaint  Patient presents with   Medical Management of Chronic Issues    HPI   Diabetes:  Complications: hyperlipidemia Glucose checking: about every other day Glucose logs: 70-83 Hypoglycemia: no Most recent A1C:5.5% Current medications: Metformin 500 mg daily.   Eye exam: March 2024 Foot checks: daily.   Hyperlipidemia: Current medications: Simvastatin 40 mg take 1 tablet once at night time.  Hypertension: Current medications: Losartan 25 take 1 tablet by mouth daily.  Diet: healthy Exercise:  daily   Weight Management: Patient has continue to eat healthy and exercise and lose weight.      03/10/2023    7:31 AM 11/04/2022    7:54 AM 08/03/2022    7:50 AM 06/02/2022   10:09 AM 06/02/2022   10:03 AM  Depression screen PHQ 2/9  Decreased Interest 0 0 0 0 0  Down, Depressed, Hopeless 0 0 0 0 0  PHQ - 2 Score 0 0 0 0 0  Altered sleeping 0 0  0   Tired, decreased energy 0 0  0   Change in appetite 0 0  0   Feeling bad or failure about yourself  0 0  0   Trouble concentrating 0 0  0   Moving slowly or fidgety/restless 0 0  0   Suicidal thoughts 0 0  0   PHQ-9 Score 0 0  0   Difficult doing work/chores Not difficult at all Not difficult at all  Not difficult at all         03/10/2023    7:30 AM  Fall Risk   Falls in the past year? 0  Number falls in past yr: 0  Injury with Fall? 0  Risk for fall due to : No Fall Risks  Follow up Falls evaluation completed;Falls prevention discussed    Patient Care Team: CoxFritzi Mandes, MD as PCP - General (Internal Medicine)   Review of Systems  Constitutional:  Negative for chills, fatigue and fever.  HENT:  Negative for congestion, ear pain, rhinorrhea and sore throat.   Respiratory:  Negative for cough and shortness of breath.   Cardiovascular:  Negative for chest pain.  Gastrointestinal:  Negative for abdominal pain,  constipation, diarrhea, nausea and vomiting.  Genitourinary:  Positive for frequency. Negative for dysuria and urgency.  Musculoskeletal:  Negative for back pain and myalgias.  Skin:        Callous right foot   Neurological:  Negative for dizziness, weakness, light-headedness and headaches.  Psychiatric/Behavioral:  Negative for dysphoric mood. The patient is not nervous/anxious.    Diabetic Foot Exam - Simple   Simple Foot Form Diabetic Foot exam was performed with the following findings: Yes 03/10/2023  7:29 AM  Visual Inspection See comments: Yes Sensation Testing Intact to touch and monofilament testing bilaterally: Yes Pulse Check Posterior Tibialis and Dorsalis pulse intact bilaterally: Yes Comments Very mild callous right foot. Tender under first right metatarsal.        Current Outpatient Medications on File Prior to Visit  Medication Sig Dispense Refill   ACCU-CHEK AVIVA PLUS test strip 1 each by Other route as needed for other. Use as instructed (Patient taking differently: 1 each by Other route daily. Use as instructed) 100 each 6   Accu-Chek Softclix Lancets lancets 1 each by Other route daily. Use as instructed 100 each 6   losartan (COZAAR) 25 MG  tablet TAKE 1 TABLET BY MOUTH DAILY. 90 tablet 0   metFORMIN (GLUCOPHAGE) 500 MG tablet Take 1 tablet (500 mg total) by mouth daily with breakfast. 90 tablet 1   simvastatin (ZOCOR) 20 MG tablet Take 1 tablet (20 mg total) by mouth daily. 90 tablet 1   No current facility-administered medications on file prior to visit.   Past Medical History:  Diagnosis Date   Allergic drug reaction 08/29/2022   Diabetes mellitus without complication (HCC)    Hyperlipidemia    Past Surgical History:  Procedure Laterality Date   COLONOSCOPY  11/03/2019   FOOT SURGERY Right 04/2017   Bunionectomy. 2nd digit hammer toe.   HYSTEROSCOPY      Family History  Problem Relation Age of Onset   Kidney cancer Mother    Dementia Father     Breast cancer Neg Hx    Social History   Socioeconomic History   Marital status: Married    Spouse name: Not on file   Number of children: Not on file   Years of education: Not on file   Highest education level: Not on file  Occupational History   Not on file  Tobacco Use   Smoking status: Never   Smokeless tobacco: Never  Substance and Sexual Activity   Alcohol use: Not Currently   Drug use: Never   Sexual activity: Yes    Partners: Male  Other Topics Concern   Not on file  Social History Narrative   Not on file   Social Determinants of Health   Financial Resource Strain: Low Risk  (06/02/2022)   Overall Financial Resource Strain (CARDIA)    Difficulty of Paying Living Expenses: Not hard at all  Food Insecurity: No Food Insecurity (06/02/2022)   Hunger Vital Sign    Worried About Running Out of Food in the Last Year: Never true    Ran Out of Food in the Last Year: Never true  Transportation Needs: No Transportation Needs (06/02/2022)   PRAPARE - Administrator, Civil Service (Medical): No    Lack of Transportation (Non-Medical): No  Physical Activity: Insufficiently Active (06/02/2022)   Exercise Vital Sign    Days of Exercise per Week: 2 days    Minutes of Exercise per Session: 20 min  Stress: No Stress Concern Present (08/03/2022)   [employer] of Occupational Health - Occupational Stress Questionnaire    Feeling of Stress : Not at all  Social Connections: Socially Integrated (06/02/2022)   Social Connection and Isolation Panel [NHANES]    Frequency of Communication with Friends and Family: More than three times a week    Frequency of Social Gatherings with Friends and Family: Once a week    Attends Religious Services: More than 4 times per year    Active Member of [employer] or Organizations: Yes    Attends Engineer, structural: More than 4 times per year    Marital Status: Married    Objective:  BP 102/64   Pulse 84   Temp (!) 96.4 F  (35.8 C)   Resp 14   Ht 5\' 4"  (1.626 m)   Wt 170 lb (77.1 kg)   BMI 29.18 kg/m      03/10/2023    7:26 AM 11/04/2022    7:47 AM 08/25/2022    1:42 PM  BP/Weight  Systolic BP 102 124 120  Diastolic BP 64 70 78  Wt. (Lbs) 170 183 203  BMI 29.18 kg/m2 31.41 kg/m2 34.84 kg/m2  Physical Exam Vitals reviewed.  Constitutional:      Appearance: Normal appearance. She is normal weight.  Neck:     Vascular: No carotid bruit.  Cardiovascular:     Rate and Rhythm: Normal rate and regular rhythm.     Heart sounds: Normal heart sounds.  Pulmonary:     Effort: Pulmonary effort is normal. No respiratory distress.     Breath sounds: Normal breath sounds.  Abdominal:     General: Abdomen is flat. Bowel sounds are normal.     Palpations: Abdomen is soft.     Tenderness: There is no abdominal tenderness.  Neurological:     Mental Status: She is alert and oriented to person, place, and time.  Psychiatric:        Mood and Affect: Mood normal.        Behavior: Behavior normal.     Diabetic Foot Exam - Simple   Simple Foot Form Diabetic Foot exam was performed with the following findings: Yes 03/10/2023  7:29 AM  Visual Inspection See comments: Yes Sensation Testing Intact to touch and monofilament testing bilaterally: Yes Pulse Check Posterior Tibialis and Dorsalis pulse intact bilaterally: Yes Comments Very mild callous right foot. Tender under first right metatarsal.       Lab Results  Component Value Date   WBC 5.1 11/04/2022   HGB 13.4 11/04/2022   HCT 39 11/04/2022   PLT 205 11/04/2022   GLUCOSE 109 (H) 02/03/2021   CHOL 163 11/04/2022   TRIG 58 11/04/2022   HDL 68 11/04/2022   LDLCALC 83 11/04/2022   ALT 24 11/04/2022   AST 29 11/04/2022   NA 139 11/04/2022   K 3.9 11/04/2022   CL 103 11/04/2022   CREATININE 0.5 11/04/2022   BUN 20 11/04/2022   CO2 29 (A) 11/04/2022   TSH 0.706 01/19/2020   HGBA1C 5.5 11/04/2022   MICROALBUR 80 06/27/2021       Assessment & Plan:    Combined hyperlipidemia associated with type 2 diabetes mellitus (HCC) Assessment & Plan: Hyperlipidemia Diabetes well controlled.  May stop checking sugars.  Recommend check feet daily. Recommend annual eye exams. Medicines: Continue Losartan 25 mg daily, metformin 500 mg twice daily, and zocor 40 mg daily. May try to decrease zocor after chol comes back.   Continue to work on eating a healthy diet and exercise.  Labs drawn today.    Orders: -     Hemoglobin A1c  Hypertension associated with diabetes (HCC) Assessment & Plan: Well controlled.  No changes to medicines. Losartan 25 mg daily. Continue to work on eating a healthy diet and exercise.  Labs drawn today.    Orders: -     CBC with Differential/Platelet -     Comprehensive metabolic panel  Mixed hyperlipidemia Assessment & Plan: Well controlled.  No changes to medicines. Simvastatin 20 mg daily Continue to work on eating a healthy diet and exercise.  Labs drawn today.    Orders: -     Lipid panel  Right foot pain -     Ambulatory referral to Podiatry     No orders of the defined types were placed in this encounter.   Orders Placed This Encounter  Procedures   CBC with Differential/Platelet   Comprehensive metabolic panel   Hemoglobin A1c   Lipid panel   Microalbumin / creatinine urine ratio   Ambulatory referral to Podiatry     Follow-up: Return in about 6 months (around 09/07/2023) for chronic follow up.  I,Marla I Leal-Borjas,acting as a scribe for Blane Ohara, MD.,have documented all relevant documentation on the behalf of Blane Ohara, MD,as directed by  Blane Ohara, MD while in the presence of Blane Ohara, MD.   An After Visit Summary was printed and given to the patient.  Blane Ohara, MD [facility] [phone]

## 2023-03-10 NOTE — Assessment & Plan Note (Addendum)
Hyperlipidemia Diabetes well controlled.  May stop checking sugars.  Recommend check feet daily. Recommend annual eye exams. Medicines: Continue Losartan 25 mg daily, metformin 500 mg twice daily, and zocor 40 mg daily. May try to decrease zocor after chol comes back.   Continue to work on eating a healthy diet and exercise.  Labs drawn today.

## 2023-03-10 NOTE — Assessment & Plan Note (Signed)
Well controlled.  No changes to medicines. Simvastatin 20 mg daily.  Continue to work on eating a healthy diet and exercise.  Labs drawn today.

## 2023-03-12 ENCOUNTER — Other Ambulatory Visit: Payer: Self-pay

## 2023-03-15 ENCOUNTER — Other Ambulatory Visit: Payer: Self-pay | Admitting: Family Medicine

## 2023-04-14 ENCOUNTER — Encounter: Payer: Self-pay | Admitting: Podiatry

## 2023-04-14 ENCOUNTER — Ambulatory Visit (INDEPENDENT_AMBULATORY_CARE_PROVIDER_SITE_OTHER): Payer: Commercial Managed Care - PPO

## 2023-04-14 ENCOUNTER — Ambulatory Visit (INDEPENDENT_AMBULATORY_CARE_PROVIDER_SITE_OTHER): Payer: Commercial Managed Care - PPO | Admitting: Podiatry

## 2023-04-14 DIAGNOSIS — L84 Corns and callosities: Secondary | ICD-10-CM

## 2023-04-14 DIAGNOSIS — M25871 Other specified joint disorders, right ankle and foot: Secondary | ICD-10-CM

## 2023-04-14 DIAGNOSIS — M79671 Pain in right foot: Secondary | ICD-10-CM

## 2023-04-14 DIAGNOSIS — M19071 Primary osteoarthritis, right ankle and foot: Secondary | ICD-10-CM | POA: Diagnosis not present

## 2023-04-14 NOTE — Progress Notes (Signed)
      Chief Complaint  Patient presents with   Toe Pain    S/p r foot kalish and 2nd toe ht with plantar plate repair in 2018. C/o plantar 1st met head pain, some callus formation, some possible dorsal hardware irritation, and 2nd mpj pain and stiffness   HPI: 62 y.o. female presents today with pain in the right foot.  She had previous right bunion and second hammertoe surgery by Dr. Marylene Land.  She states that overall she has had improvement since the surgery.  She has calluses on the ball of the right foot.  She does have some pain underneath the second toe joint.  Past Medical History:  Diagnosis Date   Allergic drug reaction 08/29/2022   Diabetes mellitus without complication (HCC)    Hyperlipidemia     Past Surgical History:  Procedure Laterality Date   COLONOSCOPY  11/03/2019   FOOT SURGERY Right 04/2017   Bunionectomy. 2nd digit hammer toe.   HYSTEROSCOPY      Allergies  Allergen Reactions   Lisinopril     cough   Oxycodone    Penicillins    Phentermine Hives    Physical Exam: General: The patient is alert and oriented x3 in no acute distress.  Dermatology: Skin is warm, dry and supple bilateral lower extremities. Interspaces are clear of maceration and debris.  There are hyperkeratotic lesions submet 1 and submet 5 right foot.  Vascular: Palpable pedal pulses bilaterally. Capillary refill within normal limits.  No appreciable edema.  No erythema or calor.  Neurological: Light touch sensation grossly intact bilateral feet.   Musculoskeletal Exam: There is pain at the right second metatarsophalangeal joint.  There is also pain to the sesamoids of the plantar aspect of the right first MPJ.  No crepitus is noted with range of motion.  Radiographic Exam (right foot, 3 weightbearing views, 04/14/2023):  Normal osseous mineralization.  There is joint space narrowing to the right first and second MPJ.  Lateral deviation of the right first and second toes at the MPJ level.   The hardware (2 screws) is intact right first metatarsal from previous bunion surgery.  There are degenerative changes noted at the second metatarsophalangeal joint.  Mild met adductus noted to the foot.  Assessment/Plan of Care: 1. Right foot pain   2. Callus of foot   3. Arthritis of right foot   4. Sesamoiditis of right foot     Discussed clinical findings with patient today.  The hyperkeratotic lesions were shaved with sterile #313 blade today.  Patient was given instructions on purchasing a sesamoid relief sleeve from [employer].  This should help the sesamoiditis pain.  However, long-term, the patient would benefit from custom molded orthotics with a submet 1 and submet 5 met head cut out and a metatarsal pad to offload the 2nd through 4th metatarsals.   Clerance Lav, DPM, FACFAS [facility]     [address] [place], [place] [address]                Office [phone]  Fax [phone]

## 2023-05-12 ENCOUNTER — Ambulatory Visit: Payer: Commercial Managed Care - PPO | Admitting: Podiatry

## 2023-05-12 ENCOUNTER — Encounter: Payer: Self-pay | Admitting: Podiatry

## 2023-05-12 DIAGNOSIS — M25871 Other specified joint disorders, right ankle and foot: Secondary | ICD-10-CM

## 2023-05-12 DIAGNOSIS — M19071 Primary osteoarthritis, right ankle and foot: Secondary | ICD-10-CM | POA: Diagnosis not present

## 2023-05-12 NOTE — Progress Notes (Signed)
      Chief Complaint  Patient presents with   Right Foot Pain    F/up for sesamoiditis and 2nd MPJ arthritis. Doing really well with sesamoiditis sleeve, pain significantly improved to 1st MPJ/sesamoid. Pain to 2nd MPJ is improved, still c/o stiffness and intermittent pain.   HPI: 62 y.o. female presents today for f/u of right 2nd MPJ arthritis and 1st MPJ sesamoiditis.  Notes she is doing much better now.    Past Medical History:  Diagnosis Date   Allergic drug reaction 08/29/2022   Diabetes mellitus without complication (HCC)    Hyperlipidemia     Past Surgical History:  Procedure Laterality Date   COLONOSCOPY  11/03/2019   FOOT SURGERY Right 04/2017   Bunionectomy. 2nd digit hammer toe.   HYSTEROSCOPY      Allergies  Allergen Reactions   Lisinopril     cough   Oxycodone    Penicillins    Phentermine Hives    Physical Exam: No pain on palpation of sesamoids, submet 1 right foot.  Minimal pain on palpation plantar right 2nd MPJ capsule.  No pain with ROM of 1st and 2nd MPJ's.  Palpable pedal pulses.  Assessment/Plan of Care: 1. Sesamoiditis of right foot   2. Arthritis of right foot     Discussed clinical findings with patient today.  Off-loading pad was made to offload the first and 2nd met heads and placed under her Brooks insole.  She'll try this for 2 months.  If much better, we can incorporate this type of offloading into a custom orthotic that she can take from shoe-to-shoe to continue off-loading the tender joint capsules and sesamoids.  She felt immediate improvement upon standing.     Clerance Lav, DPM, FACFAS [facility]     [address] [place], [place] [address]                Office [phone]  Fax [phone]

## 2023-06-01 ENCOUNTER — Other Ambulatory Visit: Payer: Self-pay | Admitting: Family Medicine

## 2023-06-01 DIAGNOSIS — Z1231 Encounter for screening mammogram for malignant neoplasm of breast: Secondary | ICD-10-CM

## 2023-06-02 ENCOUNTER — Inpatient Hospital Stay
Admission: RE | Admit: 2023-06-02 | Discharge: 2023-06-02 | Disposition: A | Discharge status: Home / Self Care | Payer: Commercial Managed Care - PPO | Source: Ambulatory Visit | Attending: Family Medicine | Admitting: Family Medicine

## 2023-06-02 DIAGNOSIS — Z1231 Encounter for screening mammogram for malignant neoplasm of breast: Secondary | ICD-10-CM

## 2023-06-21 ENCOUNTER — Other Ambulatory Visit: Payer: Self-pay | Admitting: Family Medicine

## 2023-07-08 ENCOUNTER — Ambulatory Visit: Payer: Commercial Managed Care - PPO | Admitting: Podiatry

## 2023-08-30 ENCOUNTER — Other Ambulatory Visit: Payer: Self-pay | Admitting: Family Medicine

## 2023-09-03 ENCOUNTER — Ambulatory Visit: Payer: Commercial Managed Care - PPO | Admitting: Family Medicine

## 2023-09-28 ENCOUNTER — Other Ambulatory Visit: Payer: Self-pay | Admitting: Family Medicine

## 2023-10-13 NOTE — Progress Notes (Unsigned)
 Subjective:  Patient ID: Kelsey Dickerson, female    DOB: Jul 15, 1960  Age: 63 y.o. MRN: [id number]  Chief Complaint  Patient presents with   Medical Management of Chronic Issues   HPI: Diabetes:  Complications: hyperlipidemia Glucose checking: about every other day Glucose logs: Does not check Hypoglycemia: no Most recent A1C:5.4% Current medications: None Eye exam: March 2024 Foot checks: daily.   Hyperlipidemia: Current medications: Simvastatin  20 mg take 1 tablet once at night time.  Hypertension: Current medications: Losartan  25 take 1 tablet by mouth daily.  Diet: healthy Exercise:  daily   Osteoporosis prevention: Taking calcium 500 mg daily. MVI daily. Weight Management: Patient has continue to eat healthy and exercise and lose weight.     10/14/2023    8:18 AM 03/10/2023    7:31 AM 11/04/2022    7:54 AM 08/03/2022    7:50 AM 06/02/2022   10:09 AM  Depression screen PHQ 2/9  Decreased Interest 0 0 0 0 0  Down, Depressed, Hopeless 0 0 0 0 0  PHQ - 2 Score 0 0 0 0 0  Altered sleeping  0 0  0  Tired, decreased energy  0 0  0  Change in appetite  0 0  0  Feeling bad or failure about yourself   0 0  0  Trouble concentrating  0 0  0  Moving slowly or fidgety/restless  0 0  0  Suicidal thoughts  0 0  0  PHQ-9 Score  0 0  0  Difficult doing work/chores  Not difficult at all Not difficult at all  Not difficult at all        03/10/2023    7:30 AM  Fall Risk   Falls in the past year? 0  Number falls in past yr: 0  Injury with Fall? 0  Risk for fall due to : No Fall Risks  Follow up Falls evaluation completed;Falls prevention discussed    Patient Care Team: CoxBurleigh Carp, MD as PCP - General (Internal Medicine)   Review of Systems  Constitutional:  Negative for chills, fatigue and fever.  HENT:  Negative for congestion, ear pain, rhinorrhea and sore throat.   Respiratory:  Negative for cough and shortness of breath.   Cardiovascular:  Negative for chest pain.   Gastrointestinal:  Negative for abdominal pain, constipation, diarrhea, nausea and vomiting.  Genitourinary:  Negative for dysuria and urgency.  Musculoskeletal:  Negative for back pain and myalgias.  Neurological:  Negative for dizziness, weakness, light-headedness and headaches.  Psychiatric/Behavioral:  Negative for dysphoric mood. The patient is not nervous/anxious.     Current Outpatient Medications on File Prior to Visit  Medication Sig Dispense Refill   ACCU-CHEK AVIVA PLUS test strip 1 each by Other route as needed for other. Use as instructed (Patient taking differently: 1 each by Other route daily. Use as instructed) 100 each 6   Accu-Chek Softclix Lancets lancets 1 each by Other route daily. Use as instructed 100 each 6   losartan  (COZAAR ) 25 MG tablet TAKE 1 TABLET BY MOUTH DAILY. 90 tablet 0   simvastatin  (ZOCOR ) 20 MG tablet TAKE 1 TABLET BY MOUTH DAILY. 90 tablet 0   No current facility-administered medications on file prior to visit.   Past Medical History:  Diagnosis Date   Allergic drug reaction 08/29/2022   Diabetes mellitus without complication (HCC)    Hyperlipidemia    Past Surgical History:  Procedure Laterality Date   COLONOSCOPY  11/03/2019   FOOT  SURGERY Right 04/2017   Bunionectomy. 2nd digit hammer toe.   HYSTEROSCOPY      Family History  Problem Relation Age of Onset   Kidney cancer Mother    Dementia Father    Breast cancer Neg Hx    Social History   Socioeconomic History   Marital status: Married    Spouse name: Not on file   Number of children: Not on file   Years of education: Not on file   Highest education level: Not on file  Occupational History   Not on file  Tobacco Use   Smoking status: Never   Smokeless tobacco: Never  Substance and Sexual Activity   Alcohol use: Not Currently   Drug use: Never   Sexual activity: Yes    Partners: Male  Other Topics Concern   Not on file  Social History Narrative   Not on file   Social  Drivers of Health   Financial Resource Strain: Low Risk  (10/14/2023)   Overall Financial Resource Strain (CARDIA)    Difficulty of Paying Living Expenses: Not hard at all  Food Insecurity: No Food Insecurity (10/14/2023)   Hunger Vital Sign    Worried About Running Out of Food in the Last Year: Never true    Ran Out of Food in the Last Year: Never true  Transportation Needs: No Transportation Needs (10/14/2023)   PRAPARE - Administrator, Civil Service (Medical): No    Lack of Transportation (Non-Medical): No  Physical Activity: Insufficiently Active (10/14/2023)   Exercise Vital Sign    Days of Exercise per Week: 4 days    Minutes of Exercise per Session: 30 min  Stress: No Stress Concern Present (10/14/2023)   [employer] of Occupational Health - Occupational Stress Questionnaire    Feeling of Stress : Not at all  Social Connections: Socially Integrated (10/14/2023)   Social Connection and Isolation Panel [NHANES]    Frequency of Communication with Friends and Family: More than three times a week    Frequency of Social Gatherings with Friends and Family: Once a week    Attends Religious Services: More than 4 times per year    Active Member of [employer] or Organizations: Yes    Attends Engineer, structural: More than 4 times per year    Marital Status: Married    Objective:  BP 128/76   Pulse 81   Temp 98.2 F (36.8 C)   Ht 5\' 4"  (1.626 m)   Wt 169 lb (76.7 kg)   SpO2 97%   BMI 29.01 kg/m      10/14/2023    8:16 AM 03/10/2023    7:26 AM 11/04/2022    7:47 AM  BP/Weight  Systolic BP 128 102 124  Diastolic BP 76 64 70  Wt. (Lbs) 169 170 183  BMI 29.01 kg/m2 29.18 kg/m2 31.41 kg/m2    Physical Exam Vitals reviewed.  Constitutional:      Appearance: Normal appearance.  Neck:     Vascular: No carotid bruit.  Cardiovascular:     Rate and Rhythm: Normal rate and regular rhythm.     Heart sounds: Normal heart sounds.  Pulmonary:     Effort:  Pulmonary effort is normal. No respiratory distress.     Breath sounds: Normal breath sounds.  Abdominal:     General: Abdomen is flat. Bowel sounds are normal.     Palpations: Abdomen is soft.     Tenderness: There is no abdominal tenderness.  Neurological:     Mental Status: She is alert and oriented to person, place, and time.  Psychiatric:        Mood and Affect: Mood normal.        Behavior: Behavior normal.     Diabetic Foot Exam - Simple   Simple Foot Form Diabetic Foot exam was performed with the following findings: Yes 10/14/2023  8:35 AM  Visual Inspection No deformities, no ulcerations, no other skin breakdown bilaterally: Yes Sensation Testing Intact to touch and monofilament testing bilaterally: Yes Pulse Check Posterior Tibialis and Dorsalis pulse intact bilaterally: Yes Comments Wearing a cover for callus on right foot.       Lab Results  Component Value Date   WBC 43.0 03/09/2023   HGB 13.8 03/09/2023   HCT 41 03/09/2023   PLT 190 03/09/2023   GLUCOSE 109 (H) 02/03/2021   CHOL 188 03/09/2023   TRIG 74 03/09/2023   HDL 70 03/09/2023   LDLCALC 103 03/09/2023   ALT 33 03/09/2023   AST 38 (A) 03/09/2023   NA 139 03/09/2023   K 4.0 03/09/2023   CL 104 03/09/2023   CREATININE 0.5 03/09/2023   BUN 26 (A) 03/09/2023   CO2 32 (A) 03/09/2023   TSH 0.706 01/19/2020   HGBA1C 5.4 03/09/2023   MICROALBUR 80 06/27/2021      Assessment & Plan:  Diabetes mellitus without complication (HCC) Assessment & Plan: Diabetes well controlled.  Recommend check feet daily. Recommend annual eye exams. Medicines: Continue Losartan  25 mg daily. Continue to work on eating a healthy diet and exercise.  Labs drawn today.     Mixed hyperlipidemia Assessment & Plan: Well controlled.  No changes to medicines. Simvastatin  20 mg daily Continue to work on eating a healthy diet and exercise.  Labs drawn today.     Menopausal state Assessment & Plan: Patient  menopausal, symptoms are stable.  Recommend increase calcium with D. Should get calcium 1200-1500 mg daily and it is often combined with vitamin D   BMI 29.0-29.9,adult Assessment & Plan: Recommend continue to work on eating healthy diet and exercise.       No orders of the defined types were placed in this encounter.   No orders of the defined types were placed in this encounter.    Follow-up: Return in about 6 months (around 04/14/2024) for chronic follow up.   I,Marla I Leal-Borjas,acting as a scribe for Mercy Stall, MD.,have documented all relevant documentation on the behalf of Mercy Stall, MD,as directed by  Mercy Stall, MD while in the presence of Mercy Stall, MD.   An After Visit Summary was printed and given to the patient.  I attest that I have reviewed this visit and agree with the plan scribed by my staff.   Mercy Stall, MD [facility] [phone]

## 2023-10-14 ENCOUNTER — Encounter: Payer: Self-pay | Admitting: Family Medicine

## 2023-10-14 ENCOUNTER — Ambulatory Visit: Admitting: Family Medicine

## 2023-10-14 VITALS — BP 128/76 | HR 81 | Temp 98.2°F | Ht 64.0 in | Wt 169.0 lb

## 2023-10-14 DIAGNOSIS — E119 Type 2 diabetes mellitus without complications: Secondary | ICD-10-CM | POA: Diagnosis not present

## 2023-10-14 DIAGNOSIS — E782 Mixed hyperlipidemia: Secondary | ICD-10-CM

## 2023-10-14 DIAGNOSIS — Z6829 Body mass index (BMI) 29.0-29.9, adult: Secondary | ICD-10-CM | POA: Diagnosis not present

## 2023-10-14 DIAGNOSIS — N951 Menopausal and female climacteric states: Secondary | ICD-10-CM | POA: Diagnosis not present

## 2023-10-14 DIAGNOSIS — E1169 Type 2 diabetes mellitus with other specified complication: Secondary | ICD-10-CM

## 2023-10-14 NOTE — Assessment & Plan Note (Signed)
 Diabetes well controlled.  Recommend check feet daily. Recommend annual eye exams. Medicines: Continue Losartan  25 mg daily. Continue to work on eating a healthy diet and exercise.  Labs drawn today.

## 2023-10-14 NOTE — Patient Instructions (Signed)
 Recommend increase calcium with D. Should get calcium 1200-1500 mg daily and it is often combined with vitamin D

## 2023-10-14 NOTE — Assessment & Plan Note (Addendum)
 Patient menopausal, symptoms are stable.  Recommend increase calcium with D. Should get calcium 1200-1500 mg daily and it is often combined with vitamin D

## 2023-10-14 NOTE — Assessment & Plan Note (Signed)
 Recommend continue to work on eating healthy diet and exercise.

## 2023-10-14 NOTE — Assessment & Plan Note (Signed)
Well controlled.  No changes to medicines. Simvastatin 20 mg daily.  Continue to work on eating a healthy diet and exercise.  Labs drawn today.

## 2023-12-07 ENCOUNTER — Other Ambulatory Visit: Payer: Self-pay | Admitting: Family Medicine

## 2023-12-15 ENCOUNTER — Other Ambulatory Visit: Payer: Self-pay | Admitting: Family Medicine

## 2024-01-03 ENCOUNTER — Other Ambulatory Visit: Payer: Self-pay | Admitting: Family Medicine

## 2024-04-17 ENCOUNTER — Other Ambulatory Visit: Payer: Self-pay | Admitting: Family Medicine

## 2024-04-26 ENCOUNTER — Ambulatory Visit: Admitting: Family Medicine

## 2024-04-26 VITALS — BP 108/68 | Temp 97.7°F | Resp 16 | Ht 64.0 in | Wt 173.8 lb

## 2024-04-26 DIAGNOSIS — N3941 Urge incontinence: Secondary | ICD-10-CM

## 2024-04-26 DIAGNOSIS — Z0000 Encounter for general adult medical examination without abnormal findings: Secondary | ICD-10-CM

## 2024-04-26 DIAGNOSIS — E785 Hyperlipidemia, unspecified: Secondary | ICD-10-CM

## 2024-04-26 DIAGNOSIS — E782 Mixed hyperlipidemia: Secondary | ICD-10-CM

## 2024-04-26 DIAGNOSIS — E1169 Type 2 diabetes mellitus with other specified complication: Secondary | ICD-10-CM

## 2024-04-26 DIAGNOSIS — N8111 Cystocele, midline: Secondary | ICD-10-CM

## 2024-04-26 DIAGNOSIS — N3001 Acute cystitis with hematuria: Secondary | ICD-10-CM | POA: Diagnosis not present

## 2024-04-26 DIAGNOSIS — Z124 Encounter for screening for malignant neoplasm of cervix: Secondary | ICD-10-CM

## 2024-04-26 DIAGNOSIS — H6123 Impacted cerumen, bilateral: Secondary | ICD-10-CM

## 2024-04-26 DIAGNOSIS — E119 Type 2 diabetes mellitus without complications: Secondary | ICD-10-CM

## 2024-04-26 LAB — POCT URINALYSIS DIP (CLINITEK)
Bilirubin, UA: NEGATIVE
Glucose, UA: NEGATIVE mg/dL
Ketones, POC UA: NEGATIVE mg/dL
Nitrite, UA: POSITIVE — AB
POC PROTEIN,UA: NEGATIVE
Spec Grav, UA: 1.015 (ref 1.010–1.025)
Urobilinogen, UA: 0.2 U/dL
pH, UA: 6 (ref 5.0–8.0)

## 2024-04-26 LAB — POCT LIPID PANEL
HDL: 93
LDL: 82
Non-HDL: 94
TC/HDL: 0.9
TC: 188
TRG: 61

## 2024-04-26 LAB — POCT GLYCOSYLATED HEMOGLOBIN (HGB A1C): Hemoglobin A1C: 5.4 % (ref 4.0–5.6)

## 2024-04-26 MED ORDER — GEMTESA 75 MG PO TABS: 1.0000 | ORAL_TABLET | Freq: Every day | ORAL | 3 refills | Status: DC

## 2024-04-26 NOTE — Progress Notes (Signed)
 Subjective:  Patient ID: Kelsey Dickerson, female    DOB: April 28, 1961  Age: 63 y.o. MRN: [id number]  Chief Complaint  Patient presents with   Gynecologic Exam    Well Adult Physical: Patient here for a comprehensive physical exam.The patient reports bladder dropped. Do you take any herbs or supplements that were not prescribed by a doctor? no Are you taking calcium supplements? yes Are you taking aspirin daily? no  Encounter for general adult medical examination without abnormal findings  Physical (At Risk items are starred): Patient's last physical exam was 1 year ago .  Patient is not afflicted from Stress Incontinence and Urge Incontinence  Patient wears a seat belts Patient has smoke detectors and has carbon monoxide detectors. Patient practices appropriate gun safety. Patient wears sunscreen with extended sun exposure. Dental Care: biannual cleanings, brushes and flosses daily. Ophthalmology/Optometry: Annual visit.  Hearing loss: none Vision impairments: none  Menarche: 13 Menstrual History: normal LMP: unknown Pregnancy history: 2 Safe at home: yes Self breast exams: yes  Discussed the use of AI scribe software for clinical note transcription with the patient, who gave verbal consent to proceed.  History of Present Illness Kelsey Dickerson is a 63 year old female who presents for an annual physical exam and evaluation of urinary urgency.  Urinary urgency and incontinence - Urinary urgency, particularly nocturnally - Requires immediate urination upon waking to avoid incontinence - No stress incontinence with coughing or laughing - No history of hysterectomy - History of hysteroscopy  Glycemic control and weight management - Lost 60 pounds since December 2023 (previous weight 231 pounds) - Current weight maintained since April - Resumed regular physical activity, including treadmill use, after a brief period of reduced activity - No longer taking metformin  - Considers  herself prediabetic - Manages glycemic control through diet and exercise - No pharmacological intervention for weight loss  Gynecologic and sexual health - No vaginal dryness, itching, or irritation outside of sexual activity - Experiences some vaginal dryness during sexual activity, managed with lubricant - Last Pap smear normal in 2023 - No family history of cervical cancer - Two children from two pregnancies - Menarche at age 22 with no menstrual irregularities  Ophthalmologic findings - Small cataract present - No progression of cataract  Constitutional and general symptoms - No fevers, chills, or sweats - No earaches, sore throat, chest pain, or respiratory symptoms      04/26/2024    7:58 AM 10/14/2023    8:18 AM 03/10/2023    7:31 AM 11/04/2022    7:54 AM 08/03/2022    7:50 AM  Depression screen PHQ 2/9  Decreased Interest 0 0 0 0 0  Down, Depressed, Hopeless 0 0 0 0 0  PHQ - 2 Score 0 0 0 0 0  Altered sleeping 0  0 0   Tired, decreased energy 0  0 0   Change in appetite 0  0 0   Feeling bad or failure about yourself  0  0 0   Trouble concentrating 0  0 0   Moving slowly or fidgety/restless 0  0 0   Suicidal thoughts 0  0 0   PHQ-9 Score 0   0  0    Difficult doing work/chores Not difficult at all  Not difficult at all Not difficult at all      Data saved with a previous flowsheet row definition         02/03/2021    7:42 AM 06/02/2022   10:08  AM 08/03/2022    7:51 AM 11/04/2022    7:55 AM 03/10/2023    7:30 AM  Fall Risk  Falls in the past year? 0 0 0 0 0  Was there an injury with Fall? 0 0 0 0 0  Fall Risk Category Calculator 0 0 0 0 0  Fall Risk Category (Retired) Low  Low      (RETIRED) Patient Fall Risk Level Low fall risk  Low fall risk      Patient at Risk for Falls Due to  No Fall Risks No Fall Risks No Fall Risks No Fall Risks  Fall risk Follow up   Falls evaluation completed Falls evaluation completed;Falls prevention discussed Falls evaluation  completed;Falls prevention discussed     Data saved with a previous flowsheet row definition             Social Hx   Social History   Socioeconomic History   Marital status: Married    Spouse name: Not on file   Number of children: Not on file   Years of education: Not on file   Highest education level: 12th grade  Occupational History   Not on file  Tobacco Use   Smoking status: Never   Smokeless tobacco: Never  Substance and Sexual Activity   Alcohol use: Not Currently   Drug use: Never   Sexual activity: Yes    Partners: Male  Other Topics Concern   Not on file  Social History Narrative   Not on file   Social Drivers of Health   Financial Resource Strain: Low Risk  (04/22/2024)   Overall Financial Resource Strain (CARDIA)    Difficulty of Paying Living Expenses: Not hard at all  Food Insecurity: No Food Insecurity (04/22/2024)   Hunger Vital Sign    Worried About Running Out of Food in the Last Year: Never true    Ran Out of Food in the Last Year: Never true  Transportation Needs: No Transportation Needs (04/22/2024)   PRAPARE - Administrator, Civil Service (Medical): No    Lack of Transportation (Non-Medical): No  Physical Activity: Insufficiently Active (04/22/2024)   Exercise Vital Sign    Days of Exercise per Week: 4 days    Minutes of Exercise per Session: 30 min  Stress: No Stress Concern Present (04/22/2024)   [employer] of Occupational Health - Occupational Stress Questionnaire    Feeling of Stress: Not at all  Social Connections: Socially Integrated (04/22/2024)   Social Connection and Isolation Panel    Frequency of Communication with Friends and Family: More than three times a week    Frequency of Social Gatherings with Friends and Family: More than three times a week    Attends Religious Services: More than 4 times per year    Active Member of Clubs or Organizations: Yes    Attends Banker Meetings: More than 4 times  per year    Marital Status: Married   Past Medical History:  Diagnosis Date   Allergic drug reaction 08/29/2022   Diabetes mellitus without complication (HCC)    Hyperlipidemia    Past Surgical History:  Procedure Laterality Date   COLONOSCOPY  11/03/2019   FOOT SURGERY Right 04/2017   Bunionectomy. 2nd digit hammer toe.   HYSTEROSCOPY      Family History  Problem Relation Age of Onset   Kidney cancer Mother    Dementia Father    Breast cancer Neg Hx  Review of Systems  Constitutional:  Negative for chills, fatigue and fever.  HENT:  Negative for congestion, ear pain and sore throat.   Respiratory:  Negative for cough and shortness of breath.   Cardiovascular:  Negative for chest pain.  Gastrointestinal:  Negative for abdominal pain, constipation, diarrhea, nausea and vomiting.  Genitourinary:  Positive for urgency. Negative for dysuria.  Musculoskeletal:  Negative for arthralgias and myalgias.  Skin:  Negative for rash.  Neurological:  Negative for dizziness and headaches.  Psychiatric/Behavioral:  Negative for dysphoric mood. The patient is not nervous/anxious.      Objective:  BP 108/68   Temp 97.7 F (36.5 C) (Temporal)   Resp 16   Ht 5' 4 (1.626 m)   Wt 173 lb 12.8 oz (78.8 kg)   BMI 29.83 kg/m      04/26/2024    7:52 AM 10/14/2023    8:16 AM 03/10/2023    7:26 AM  BP/Weight  Systolic BP 108 128 102  Diastolic BP 68 76 64  Wt. (Lbs) 173.8 169 170  BMI 29.83 kg/m2 29.01 kg/m2 29.18 kg/m2    Physical Exam Vitals reviewed. Exam conducted with a chaperone present.  Constitutional:      General: She is not in acute distress.    Appearance: Normal appearance. She is normal weight.  HENT:     Right Ear: There is impacted cerumen.     Left Ear: There is impacted cerumen.     Nose: Nose normal. No congestion or rhinorrhea.     Mouth/Throat:     Pharynx: No oropharyngeal exudate or posterior oropharyngeal erythema.  Eyes:     Conjunctiva/sclera:  Conjunctivae normal.  Neck:     Thyroid: No thyroid mass.     Vascular: No carotid bruit.  Cardiovascular:     Rate and Rhythm: Normal rate and regular rhythm.     Pulses: Normal pulses.     Heart sounds: Normal heart sounds. No murmur heard. Pulmonary:     Effort: Pulmonary effort is normal.     Breath sounds: Normal breath sounds.  Abdominal:     General: Bowel sounds are normal.     Palpations: Abdomen is soft. There is no mass.     Tenderness: There is no abdominal tenderness.  Genitourinary:    General: Normal vulva.     Exam position: Lithotomy position.     Vagina: Prolapsed vaginal walls present.     Cervix: Normal.     Comments: Cystocele present.  Lymphadenopathy:     Cervical: No cervical adenopathy.  Neurological:     Mental Status: She is alert and oriented to person, place, and time.     Cranial Nerves: No cranial nerve deficit.  Psychiatric:        Mood and Affect: Mood normal.        Behavior: Behavior normal.     Lab Results  Component Value Date   WBC 3.8 04/26/2024   HGB 13.5 04/26/2024   HCT 42.1 04/26/2024   PLT 187 04/26/2024   GLUCOSE 98 04/26/2024   CHOL 188 03/09/2023   TRIG 74 03/09/2023   HDL 70 03/09/2023   LDLCALC 103 03/09/2023   ALT 28 04/26/2024   AST 28 04/26/2024   NA 143 04/26/2024   K 4.5 04/26/2024   CL 104 04/26/2024   CREATININE 0.58 04/26/2024   BUN 24 04/26/2024   CO2 20 04/26/2024   TSH 0.884 04/26/2024   HGBA1C 5.4 04/26/2024   MICROALBUR 80 06/27/2021  Assessment & Plan:   Assessment & Plan General medical exam Routine wellness visit with weight loss and increased exercise. Blood pressure controlled. No depression. Regular dental and eye exams. No Pap smear since 2023. Prediabetic with previous weight loss efforts. No current metformin  use. Menstrual history normal. Managed vaginal dryness with lubricant. - Ordered blood count, kidney, and liver function tests. - Obtained urine sample for infection and  proteinuria. - Performed Pap smear. - Advised on bladder drills for urge incontinence. - Discussed medication options for urge incontinence. - Discussed surgical options for urge incontinence if necessary. Orders:   CBC with Differential/Platelet   Comprehensive metabolic panel with GFR   TSH  Hyperlipidemia associated with type 2 diabetes mellitus (HCC) Check labs.  Orders:   POCT glycosylated hemoglobin (Hb A1C)   POCT Lipid Panel   Microalbumin / creatinine urine ratio  Urge incontinence Start on gemtesa.  Insurance denied coverage.  Changed to vesicare 5 mg daily.   Orders:   POCT URINALYSIS DIP (CLINITEK)   Urine Culture  Acute cystitis with hematuria Slight abnormalities of urine.  Check urine culture. Orders:   Urine Culture  Cervical cancer screening Pap ordered Orders:   IGP, Aptima HPV, rfx 16/18,45  Bilateral impacted cerumen Education given. May return if needed for irrigation.    Cystocele, midline Consider referral if does not improve with vesicare.        Body mass index is 29.83 kg/m.    This is a list of the screening recommended for you and due dates:  Health Maintenance  Topic Date Due   Pneumococcal Vaccine for age over 76 (1 of 2 - PCV) Never done   Eye exam for diabetics  10/26/2023   COVID-19 Vaccine (2 - 2025-26 season) 02/21/2024   Flu Shot  09/19/2024*   Complete foot exam   10/13/2024   Hemoglobin A1C  10/24/2024   Yearly kidney function blood test for diabetes  04/26/2025   Yearly kidney health urinalysis for diabetes  04/26/2025   Breast Cancer Screening  06/01/2025   Pap with HPV screening  06/03/2027   Colon Cancer Screening  11/02/2029   DTaP/Tdap/Td vaccine (2 - Td or Tdap) 10/05/2030   Hepatitis C Screening  Completed   HIV Screening  Completed   Zoster (Shingles) Vaccine  Completed   Hepatitis B Vaccine  Aged Out   HPV Vaccine  Aged Out   Meningitis B Vaccine  Aged Out  *Topic was postponed. The date shown is  not the original due date.     Meds ordered this encounter  Medications   DISCONTD: Vibegron (GEMTESA) 75 MG TABS    Sig: Take 1 tablet (75 mg total) by mouth daily.    Dispense:  90 tablet    Refill:  3   ciprofloxacin (CIPRO) 250 MG tablet    Sig: Take 1 tablet (250 mg total) by mouth 2 (two) times daily for 5 days.    Dispense:  10 tablet    Refill:  0    Follow-up: Return in about 6 months (around 10/24/2024) for Nurse visit for ear irrigation in 1 week.  An After Visit Summary was printed and given to the patient.  Abigail Free, MD [facility] [phone]

## 2024-04-26 NOTE — Patient Instructions (Signed)
 Preventive Care 62-62 Years Old, Female  Preventive care refers to lifestyle choices and visits with your health care provider that can promote health and wellness. Preventive care visits are also called wellness exams.  What can I expect for my preventive care visit?  Counseling  Your health care provider may ask you questions about your:  Medical history, including:  Past medical problems.  Family medical history.  Pregnancy history.  Current health, including:  Menstrual cycle.  Method of birth control.  Emotional well-being.  Home life and relationship well-being.  Sexual activity and sexual health.  Lifestyle, including:  Alcohol, nicotine or tobacco, and drug use.  Access to firearms.  Diet, exercise, and sleep habits.  Work and work Astronomer.  Sunscreen use.  Safety issues such as seatbelt and bike helmet use.  Physical exam  Your health care provider will check your:  Height and weight. These may be used to calculate your BMI (body mass index). BMI is a measurement that tells if you are at a healthy weight.  Waist circumference. This measures the distance around your waistline. This measurement also tells if you are at a healthy weight and may help predict your risk of certain diseases, such as type 2 diabetes and high blood pressure.  Heart rate and blood pressure.  Body temperature.  Skin for abnormal spots.  What immunizations do I need?    Vaccines are usually given at various ages, according to a schedule. Your health care provider will recommend vaccines for you based on your age, medical history, and lifestyle or other factors, such as travel or where you work.  What tests do I need?  Screening  Your health care provider may recommend screening tests for certain conditions. This may include:  Lipid and cholesterol levels.  Diabetes screening. This is done by checking your blood sugar (glucose) after you have not eaten for a while (fasting).  Pelvic exam and Pap test.  Hepatitis B test.  Hepatitis C  test.  HIV (human immunodeficiency virus) test.  STI (sexually transmitted infection) testing, if you are at risk.  Lung cancer screening.  Colorectal cancer screening.  Mammogram. Talk with your health care provider about when you should start having regular mammograms. This may depend on whether you have a family history of breast cancer.  BRCA-related cancer screening. This may be done if you have a family history of breast, ovarian, tubal, or peritoneal cancers.  Bone density scan. This is done to screen for osteoporosis.  Talk with your health care provider about your test results, treatment options, and if necessary, the need for more tests.  Follow these instructions at home:  Eating and drinking    Eat a diet that includes fresh fruits and vegetables, whole grains, lean protein, and low-fat dairy products.  Take vitamin and mineral supplements as recommended by your health care provider.  Do not drink alcohol if:  Your health care provider tells you not to drink.  You are pregnant, may be pregnant, or are planning to become pregnant.  If you drink alcohol:  Limit how much you have to 0-1 drink a day.  Know how much alcohol is in your drink. In the [place] one drink equals one 12 oz bottle of beer (355 mL), one 5 oz glass of wine (148 mL), or one 1 oz glass of hard liquor (44 mL).  Lifestyle  Brush your teeth every morning and night with fluoride toothpaste. Floss one time each day.  Exercise for at least  30 minutes 5 or more days each week.  Do not use any products that contain nicotine or tobacco. These products include cigarettes, chewing tobacco, and vaping devices, such as e-cigarettes. If you need help quitting, ask your health care provider.  Do not use drugs.  If you are sexually active, practice safe sex. Use a condom or other form of protection to prevent STIs.  If you do not wish to become pregnant, use a form of birth control. If you plan to become pregnant, see your health care provider for a  prepregnancy visit.  Take aspirin only as told by your health care provider. Make sure that you understand how much to take and what form to take. Work with your health care provider to find out whether it is safe and beneficial for you to take aspirin daily.  Find healthy ways to manage stress, such as:  Meditation, yoga, or listening to music.  Journaling.  Talking to a trusted person.  Spending time with friends and family.  Minimize exposure to UV radiation to reduce your risk of skin cancer.  Safety  Always wear your seat belt while driving or riding in a vehicle.  Do not drive:  If you have been drinking alcohol. Do not ride with someone who has been drinking.  When you are tired or distracted.  While texting.  If you have been using any mind-altering substances or drugs.  Wear a helmet and other protective equipment during sports activities.  If you have firearms in your house, make sure you follow all gun safety procedures.  Seek help if you have been physically or sexually abused.  What's next?  Visit your health care provider once a year for an annual wellness visit.  Ask your health care provider how often you should have your eyes and teeth checked.  Stay up to date on all vaccines.  This information is not intended to replace advice given to you by your health care provider. Make sure you discuss any questions you have with your health care provider.  Document Revised: 12/04/2020 Document Reviewed: 12/04/2020  Elsevier Patient Education  2024 [employer].

## 2024-04-27 ENCOUNTER — Ambulatory Visit: Payer: Self-pay | Admitting: Family Medicine

## 2024-04-27 LAB — CBC WITH DIFFERENTIAL/PLATELET
Basophils Absolute: 0 x10E3/uL (ref 0.0–0.2)
Basos: 1 %
EOS (ABSOLUTE): 0.1 x10E3/uL (ref 0.0–0.4)
Eos: 3 %
Hematocrit: 42.1 % (ref 34.0–46.6)
Hemoglobin: 13.5 g/dL (ref 11.1–15.9)
Immature Grans (Abs): 0 x10E3/uL (ref 0.0–0.1)
Immature Granulocytes: 0 %
Lymphocytes Absolute: 1.3 x10E3/uL (ref 0.7–3.1)
Lymphs: 35 %
MCH: 30.5 pg (ref 26.6–33.0)
MCHC: 32.1 g/dL (ref 31.5–35.7)
MCV: 95 fL (ref 79–97)
Monocytes Absolute: 0.4 x10E3/uL (ref 0.1–0.9)
Monocytes: 10 %
Neutrophils Absolute: 1.9 x10E3/uL (ref 1.4–7.0)
Neutrophils: 51 %
Platelets: 187 x10E3/uL (ref 150–450)
RBC: 4.42 x10E6/uL (ref 3.77–5.28)
RDW: 13.1 % (ref 11.7–15.4)
WBC: 3.8 x10E3/uL (ref 3.4–10.8)

## 2024-04-27 LAB — COMPREHENSIVE METABOLIC PANEL WITH GFR
ALT: 28 IU/L (ref 0–32)
AST: 28 IU/L (ref 0–40)
Albumin: 4.4 g/dL (ref 3.9–4.9)
Alkaline Phosphatase: 120 IU/L (ref 49–135)
BUN/Creatinine Ratio: 41 — ABNORMAL HIGH (ref 12–28)
BUN: 24 mg/dL (ref 8–27)
Bilirubin Total: 0.4 mg/dL (ref 0.0–1.2)
CO2: 20 mmol/L (ref 20–29)
Calcium: 9.5 mg/dL (ref 8.7–10.3)
Chloride: 104 mmol/L (ref 96–106)
Creatinine, Ser: 0.58 mg/dL (ref 0.57–1.00)
Globulin, Total: 2.4 g/dL (ref 1.5–4.5)
Glucose: 98 mg/dL (ref 70–99)
Potassium: 4.5 mmol/L (ref 3.5–5.2)
Sodium: 143 mmol/L (ref 134–144)
Total Protein: 6.8 g/dL (ref 6.0–8.5)
eGFR: 102 mL/min/1.73 (ref >59)

## 2024-04-27 LAB — TSH: TSH: 0.884 u[IU]/mL (ref 0.450–4.500)

## 2024-04-27 LAB — MICROALBUMIN / CREATININE URINE RATIO
Creatinine, Urine: 59.1 mg/dL
Microalb/Creat Ratio: 82 mg/g{creat} — ABNORMAL HIGH (ref 0–29)
Microalbumin, Urine: 48.2 ug/mL

## 2024-04-28 ENCOUNTER — Telehealth: Payer: Self-pay

## 2024-04-28 MED ORDER — LOSARTAN POTASSIUM 25 MG PO TABS: 25.0000 mg | ORAL_TABLET | Freq: Two times a day (BID) | ORAL | 0 refills | Status: DC

## 2024-04-28 MED ORDER — SOLIFENACIN SUCCINATE 5 MG PO TABS: 5.0000 mg | ORAL_TABLET | Freq: Every day | ORAL | 0 refills | Status: DC

## 2024-04-28 NOTE — Telephone Encounter (Signed)
 PA submitted and denied via insurance.  Per Dr. Sherre change to vesicare 5 mg daily.

## 2024-04-28 NOTE — Addendum Note (Signed)
 Addended by: FOREST BOWLING A on: 04/28/2024 08:46 AM   Modules accepted: Orders

## 2024-04-29 ENCOUNTER — Encounter: Payer: Self-pay | Admitting: Family Medicine

## 2024-04-29 DIAGNOSIS — N8111 Cystocele, midline: Secondary | ICD-10-CM | POA: Insufficient documentation

## 2024-04-29 DIAGNOSIS — Z0000 Encounter for general adult medical examination without abnormal findings: Secondary | ICD-10-CM | POA: Insufficient documentation

## 2024-04-29 DIAGNOSIS — H6123 Impacted cerumen, bilateral: Secondary | ICD-10-CM | POA: Insufficient documentation

## 2024-04-29 DIAGNOSIS — N3001 Acute cystitis with hematuria: Secondary | ICD-10-CM | POA: Insufficient documentation

## 2024-04-29 DIAGNOSIS — Z124 Encounter for screening for malignant neoplasm of cervix: Secondary | ICD-10-CM | POA: Insufficient documentation

## 2024-04-29 DIAGNOSIS — N3941 Urge incontinence: Secondary | ICD-10-CM | POA: Insufficient documentation

## 2024-04-29 MED ORDER — CIPROFLOXACIN HCL 250 MG PO TABS: 250.0000 mg | ORAL_TABLET | Freq: Two times a day (BID) | ORAL | 0 refills | Status: AC

## 2024-04-29 NOTE — Assessment & Plan Note (Signed)
 Routine wellness visit with weight loss and increased exercise. Blood pressure controlled. No depression. Regular dental and eye exams. No Pap smear since 2023. Prediabetic with previous weight loss efforts. No current metformin  use. Menstrual history normal. Managed vaginal dryness with lubricant. - Ordered blood count, kidney, and liver function tests. - Obtained urine sample for infection and proteinuria. - Performed Pap smear. - Advised on bladder drills for urge incontinence. - Discussed medication options for urge incontinence. - Discussed surgical options for urge incontinence if necessary. Orders:   CBC with Differential/Platelet   Comprehensive metabolic panel with GFR   TSH

## 2024-04-29 NOTE — Assessment & Plan Note (Signed)
 Pap ordered Orders:   IGP, Aptima HPV, rfx 16/18,45

## 2024-04-29 NOTE — Assessment & Plan Note (Signed)
 Education given. May return if needed for irrigation.

## 2024-04-29 NOTE — Assessment & Plan Note (Signed)
 Start on gemtesa.  Insurance denied coverage.  Changed to vesicare 5 mg daily.   Orders:   POCT URINALYSIS DIP (CLINITEK)   Urine Culture

## 2024-04-29 NOTE — Assessment & Plan Note (Signed)
 Slight abnormalities of urine.  Check urine culture. Orders:   Urine Culture

## 2024-04-29 NOTE — Assessment & Plan Note (Signed)
 Consider referral if does not improve with vesicare.

## 2024-04-29 NOTE — Assessment & Plan Note (Signed)
 Check labs.  Orders:   POCT glycosylated hemoglobin (Hb A1C)   POCT Lipid Panel   Microalbumin / creatinine urine ratio

## 2024-05-01 LAB — IGP, APTIMA HPV, RFX 16/18,45
HPV Aptima: NEGATIVE
PAP Smear Comment: 0

## 2024-05-03 LAB — URINE CULTURE

## 2024-05-23 ENCOUNTER — Other Ambulatory Visit: Payer: Self-pay | Admitting: Family Medicine

## 2024-05-23 ENCOUNTER — Telehealth: Payer: Self-pay | Admitting: Family Medicine

## 2024-05-23 DIAGNOSIS — N3941 Urge incontinence: Secondary | ICD-10-CM

## 2024-05-23 MED ORDER — SOLIFENACIN SUCCINATE 5 MG PO TABS: 5.0000 mg | ORAL_TABLET | Freq: Every day | ORAL | 0 refills | Status: DC

## 2024-05-23 NOTE — Telephone Encounter (Signed)
 Copied from CRM #[id number]. Topic: Clinical - Prescription Issue >> May 23, 2024  8:25 AM Darshell M wrote:  Reason for CRM: Vesicare  medication prescribed at last visit- 30 day supply (no refills). Patient has approximately 10 pills left. Patient wants to know if she should keep taking this medication. If so, she needs a refill. Patient CB# [phone] - rhona is not working.

## 2024-06-09 ENCOUNTER — Other Ambulatory Visit: Payer: Self-pay | Admitting: Family Medicine

## 2024-06-09 DIAGNOSIS — Z1231 Encounter for screening mammogram for malignant neoplasm of breast: Secondary | ICD-10-CM

## 2024-06-19 ENCOUNTER — Ambulatory Visit
Admission: RE | Admit: 2024-06-19 | Discharge: 2024-06-19 | Disposition: A | Source: Ambulatory Visit | Attending: Family Medicine | Admitting: Family Medicine

## 2024-06-19 DIAGNOSIS — Z1231 Encounter for screening mammogram for malignant neoplasm of breast: Secondary | ICD-10-CM

## 2024-06-23 ENCOUNTER — Ambulatory Visit: Payer: Self-pay | Admitting: Family Medicine

## 2024-06-29 ENCOUNTER — Other Ambulatory Visit: Payer: Self-pay | Admitting: Family Medicine

## 2024-06-29 DIAGNOSIS — N3941 Urge incontinence: Secondary | ICD-10-CM

## 2024-07-27 ENCOUNTER — Other Ambulatory Visit: Payer: Self-pay | Admitting: Family Medicine

## 2024-10-24 ENCOUNTER — Ambulatory Visit: Admitting: Family Medicine
